# Patient Record
Sex: Male | Born: 1965 | Race: White | Hispanic: No | Marital: Married | Smoking: Never smoker
Health system: Southern US, Community
[De-identification: ages and names within clinical notes are randomized; demographics above are authoritative.]

## PROBLEM LIST (undated history)

## (undated) DIAGNOSIS — I10 Essential (primary) hypertension: Secondary | ICD-10-CM

## (undated) DIAGNOSIS — Z860100 Personal history of colon polyps, unspecified: Secondary | ICD-10-CM

## (undated) DIAGNOSIS — Z87442 Personal history of urinary calculi: Secondary | ICD-10-CM

## (undated) DIAGNOSIS — Z8719 Personal history of other diseases of the digestive system: Secondary | ICD-10-CM

## (undated) DIAGNOSIS — K219 Gastro-esophageal reflux disease without esophagitis: Secondary | ICD-10-CM

## (undated) DIAGNOSIS — K5790 Diverticulosis of intestine, part unspecified, without perforation or abscess without bleeding: Secondary | ICD-10-CM

## (undated) DIAGNOSIS — N400 Enlarged prostate without lower urinary tract symptoms: Secondary | ICD-10-CM

## (undated) HISTORY — PX: APPENDECTOMY: SHX54

## (undated) HISTORY — DX: Essential (primary) hypertension: I10

## (undated) HISTORY — DX: Gastro-esophageal reflux disease without esophagitis: K21.9

## (undated) HISTORY — PX: OTHER SURGICAL HISTORY: SHX169

---

## 1999-12-15 HISTORY — PX: CARDIAC CATHETERIZATION: SHX172

## 2000-03-09 ENCOUNTER — Ambulatory Visit (HOSPITAL_COMMUNITY): Admission: RE | Admit: 2000-03-09 | Discharge: 2000-03-09 | Payer: Self-pay | Admitting: Family Medicine

## 2000-09-01 ENCOUNTER — Encounter: Payer: Self-pay | Admitting: Emergency Medicine

## 2000-09-01 ENCOUNTER — Emergency Department (HOSPITAL_COMMUNITY): Admission: EM | Admit: 2000-09-01 | Discharge: 2000-09-01 | Payer: Self-pay | Admitting: Emergency Medicine

## 2000-09-23 ENCOUNTER — Encounter: Payer: Self-pay | Admitting: Cardiovascular Disease

## 2000-09-23 ENCOUNTER — Ambulatory Visit (HOSPITAL_COMMUNITY): Admission: RE | Admit: 2000-09-23 | Discharge: 2000-09-23 | Payer: Self-pay | Admitting: Cardiovascular Disease

## 2002-04-02 ENCOUNTER — Emergency Department (HOSPITAL_COMMUNITY): Admission: EM | Admit: 2002-04-02 | Discharge: 2002-04-02 | Payer: Self-pay | Admitting: Emergency Medicine

## 2002-04-02 ENCOUNTER — Encounter: Payer: Self-pay | Admitting: Emergency Medicine

## 2005-12-10 ENCOUNTER — Emergency Department (HOSPITAL_COMMUNITY): Admission: EM | Admit: 2005-12-10 | Discharge: 2005-12-10 | Payer: Self-pay | Admitting: Emergency Medicine

## 2009-02-01 ENCOUNTER — Ambulatory Visit: Payer: Self-pay | Admitting: Family Medicine

## 2009-02-01 DIAGNOSIS — I10 Essential (primary) hypertension: Secondary | ICD-10-CM | POA: Insufficient documentation

## 2009-02-01 DIAGNOSIS — K219 Gastro-esophageal reflux disease without esophagitis: Secondary | ICD-10-CM | POA: Insufficient documentation

## 2009-02-01 DIAGNOSIS — F17200 Nicotine dependence, unspecified, uncomplicated: Secondary | ICD-10-CM | POA: Insufficient documentation

## 2009-02-01 DIAGNOSIS — E669 Obesity, unspecified: Secondary | ICD-10-CM | POA: Insufficient documentation

## 2009-06-08 ENCOUNTER — Emergency Department (HOSPITAL_COMMUNITY): Admission: EM | Admit: 2009-06-08 | Discharge: 2009-06-08 | Payer: Self-pay | Admitting: Emergency Medicine

## 2010-04-21 ENCOUNTER — Telehealth: Payer: Self-pay | Admitting: Family Medicine

## 2010-04-25 ENCOUNTER — Ambulatory Visit: Payer: Self-pay | Admitting: Family Medicine

## 2010-04-27 LAB — CONVERTED CEMR LAB
ALT: 35 units/L (ref 0–53)
AST: 27 units/L (ref 0–37)
Albumin: 4.1 g/dL (ref 3.5–5.2)
Alkaline Phosphatase: 84 units/L (ref 39–117)
BUN: 15 mg/dL (ref 6–23)
Basophils Absolute: 0 10*3/uL (ref 0.0–0.1)
Basophils Relative: 0.4 % (ref 0.0–3.0)
Bilirubin, Direct: 0.1 mg/dL (ref 0.0–0.3)
CO2: 32 meq/L (ref 19–32)
Calcium: 9.4 mg/dL (ref 8.4–10.5)
Chloride: 105 meq/L (ref 96–112)
Cholesterol: 202 mg/dL — ABNORMAL HIGH (ref 0–200)
Creatinine, Ser: 1 mg/dL (ref 0.4–1.5)
Direct LDL: 108.7 mg/dL
Eosinophils Absolute: 0.5 10*3/uL (ref 0.0–0.7)
Eosinophils Relative: 5.2 % — ABNORMAL HIGH (ref 0.0–5.0)
GFR calc non Af Amer: 81.92 mL/min (ref >60)
Glucose, Bld: 90 mg/dL (ref 70–99)
HCT: 43.6 % (ref 39.0–52.0)
HDL: 30.4 mg/dL — ABNORMAL LOW (ref >39.00)
Hemoglobin: 15 g/dL (ref 13.0–17.0)
Lymphocytes Relative: 34.8 % (ref 12.0–46.0)
Lymphs Abs: 3.2 10*3/uL (ref 0.7–4.0)
MCHC: 34.3 g/dL (ref 30.0–36.0)
MCV: 89.8 fL (ref 78.0–100.0)
Monocytes Absolute: 0.5 10*3/uL (ref 0.1–1.0)
Monocytes Relative: 5.5 % (ref 3.0–12.0)
Neutro Abs: 5 10*3/uL (ref 1.4–7.7)
Neutrophils Relative %: 54.1 % (ref 43.0–77.0)
Platelets: 242 10*3/uL (ref 150.0–400.0)
Potassium: 4.8 meq/L (ref 3.5–5.1)
RBC: 4.86 M/uL (ref 4.22–5.81)
RDW: 13.4 % (ref 11.5–14.6)
Sodium: 144 meq/L (ref 135–145)
Total Bilirubin: 0.7 mg/dL (ref 0.3–1.2)
Total CHOL/HDL Ratio: 7
Total Protein: 6.9 g/dL (ref 6.0–8.3)
Triglycerides: 320 mg/dL — ABNORMAL HIGH (ref 0.0–149.0)
VLDL: 64 mg/dL — ABNORMAL HIGH (ref 0.0–40.0)
WBC: 9.2 10*3/uL (ref 4.5–10.5)

## 2010-05-14 ENCOUNTER — Ambulatory Visit: Payer: Self-pay | Admitting: Family Medicine

## 2010-10-14 ENCOUNTER — Telehealth: Payer: Self-pay | Admitting: Family Medicine

## 2010-10-15 ENCOUNTER — Ambulatory Visit: Payer: Self-pay | Admitting: Family Medicine

## 2010-10-15 DIAGNOSIS — H6120 Impacted cerumen, unspecified ear: Secondary | ICD-10-CM | POA: Insufficient documentation

## 2010-10-15 DIAGNOSIS — H6121 Impacted cerumen, right ear: Secondary | ICD-10-CM | POA: Insufficient documentation

## 2011-01-13 NOTE — Progress Notes (Signed)
Summary: Nadolol  Phone Note Refill Request Call back at Home Phone [phone]   Refills Requested: Medication #1:  NADOLOL 20 MG TABS Take 1 tablet by mouth once a day [facility]  Initial call taken by: Melody Comas,  Apr 21, 2010 9:08 AM  Follow-up for Phone Call        Refill sent to  pharmacy. Follow-up by: Lowella Petties CMA,  Apr 21, 2010 9:17 AM    Prescriptions: NADOLOL 20 MG TABS (NADOLOL) Take 1 tablet by mouth once a day  #30 x 0   Entered by:   Lowella Petties CMA   Authorized by:   Hannah Beat MD   Signed by:   Lowella Petties CMA on 04/21/2010   Method used:   Electronically to        [facility]* (retail)       [address]       [place], [place]  [address]       Ph: [phone]       Fax: [phone]   RxID:   [id number]

## 2011-01-13 NOTE — Progress Notes (Signed)
Summary: ? Ear drops  Phone Note Call from Patient Call back at [phone]   Caller: Spouse Call For: Edgar Beat MD Summary of Call: Patient is scheduled to come in tomorrow to have his ears cleaned out. Patient has some OTC drops that he has used in the past to help with this (does not know the name of them) and wants to know should he use them tonight prior to his appt? Initial call taken by: Sydell Axon LPN,  October 14, 2010 9:17 AM  Follow-up for Phone Call        yes Follow-up by: Kerby Nora MD,  October 14, 2010 11:20 AM  Additional Follow-up for Phone Call Additional follow up Details #1::        Patient advised via message okay to use drops Additional Follow-up by: Benny Lennert CMA Duncan Dull),  October 14, 2010 11:36 AM

## 2011-01-13 NOTE — Assessment & Plan Note (Signed)
Summary: EARS/CLE   Vital Signs:  Patient profile:   45 year old male Height:      68 inches Weight:      237.0 pounds BMI:     36.17 Temp:     99.2 degrees F oral Pulse rate:   80 / minute Pulse rhythm:   regular BP sitting:   140 / 90  (left arm) Cuff size:   large  Vitals Entered By: Benny Lennert CMA Duncan Dull) (October 15, 2010 3:30 PM)  History of Present Illness: Chief complaint wants ears cleaned  the patient is having some muffled hearing, and has a chronic cerumen impaction. He is having some discomfort, right greater than left ear.  No fever, chills, sweats, nausea or vomiting. Ear eating and drinking normally.  GEN: Well-developed,well-nourished,in no acute distress; alert,appropriate and cooperative throughout examination HEENT: Normocephalic and atraumatic without obvious abnormalities. No apparent alopecia or balding. Ears, externally no deformitiescomplete cerumen impaction, right, with left incomplete cerumen impaction. Nontender to palpation. PULM: Breathing comfortably in no respiratory distress EXT: No clubbing, cyanosis, or edema PSYCH: Normally interactive. Cooperative during the interview. Pleasant. Friendly and conversant. Not anxious or depressed appearing. Normal, full affect.   Allergies (verified): No Known Drug Allergies   Impression & Recommendations:  Problem # 1:  CERUMEN IMPACTION, BILATERAL (ICD-380.4) no infection, ears flushed.  Complete Medication List: 1)  Nadolol 20 Mg Tabs (Nadolol) .... Take 1 tablet by mouth once a day 2)  Nexium 40 Mg Cpdr (Esomeprazole magnesium) .... Take 1 tablet by mouth once a day 3)  Aspirin 81 Mg Tabs (Aspirin) .... Take 1 tablet by mouth once a day   Orders Added: 1)  Est. Patient Level III [56213]    Current Allergies (reviewed today): No known allergies

## 2011-01-13 NOTE — Assessment & Plan Note (Signed)
Summary: cpx   Vital Signs:  Patient profile:   45 year old male Height:      68 inches Weight:      235.8 pounds BMI:     35.98 Temp:     98.2 degrees F oral Pulse rate:   80 / minute Pulse rhythm:   regular BP sitting:   110 / 70  (left arm) Cuff size:   large  Vitals Entered By: Benny Lennert CMA Duncan Dull) (May 14, 2010 2:48 PM)  History of Present Illness: Chief complaint cpx     Preventive Screening-Counseling & Management  Alcohol-Tobacco     Alcohol drinks/day: <1     Smoking Status: never     Cans of tobacco/week: 3     Tobacco Counseling: to quit use of tobacco products  Caffeine-Diet-Exercise     Diet Counseling: to improve diet; diet is suboptimal     Does Patient Exercise: yes     Exercise (avg: min/session): 30-60     Times/week: 5     Exercise Counseling: to improve exercise regimen  Hep-HIV-STD-Contraception     HIV Risk: no risk noted     STD Risk: no risk noted     Testicular SE Education/Counseling to perform regular STE     Sun Exposure Counseling: to decrease sun exposure      Sexual History:  currently monogamous.        Drug Use:  never.    Clinical Review Panels:  Lipid Management   Cholesterol:  202 (04/25/2010)   HDL (good cholesterol):  30.40 (04/25/2010)  CBC   WBC:  9.2 (04/25/2010)   RBC:  4.86 (04/25/2010)   Hgb:  15.0 (04/25/2010)   Hct:  43.6 (04/25/2010)   Platelets:  242.0 (04/25/2010)   MCV  89.8 (04/25/2010)   MCHC  34.3 (04/25/2010)   RDW  13.4 (04/25/2010)   PMN:  54.1 (04/25/2010)   Lymphs:  34.8 (04/25/2010)   Monos:  5.5 (04/25/2010)   Eosinophils:  5.2 (04/25/2010)   Basophil:  0.4 (04/25/2010)  Complete Metabolic Panel   Glucose:  90 (04/25/2010)   Sodium:  144 (04/25/2010)   Potassium:  4.8 (04/25/2010)   Chloride:  105 (04/25/2010)   CO2:  32 (04/25/2010)   BUN:  15 (04/25/2010)   Creatinine:  1.0 (04/25/2010)   Albumin:  4.1 (04/25/2010)   Total Protein:  6.9 (04/25/2010)   Calcium:  9.4  (04/25/2010)   Total Bili:  0.7 (04/25/2010)   Alk Phos:  84 (04/25/2010)   SGPT (ALT):  35 (04/25/2010)   SGOT (AST):  27 (04/25/2010)   Allergies (verified): No Known Drug Allergies  Past History:  Past medical, surgical, family and social histories (including risk factors) reviewed, and no changes noted (except as noted below).  Past Medical History: Reviewed history from 02/01/2009 and no changes required. HYPERTENSION (ICD-401.9) GERD (ICD-530.81) (Hiatal Hernia) Dips 1/2 tin a day    Past Surgical History: Reviewed history from 02/01/2009 and no changes required. Appendectomy   -  45 years old  Cardiac Cath,  ~ 2001, clean  Family History: Reviewed history from 02/01/2009 and no changes required. Family History Diabetes 1st degree relative (Mom) Father, d/c accident Sibs, healthy  Social History: Reviewed history from 02/01/2009 and no changes required. Chews tobacco, No smoking Married Maisie Fus bus Alcohol use-no Drug use-no Regular exercise-no 5 people living at [employer] Patient Exercise:  yes HIV Risk:  no risk noted STD Risk:  no risk noted Sexual History:  currently  monogamous Drug Use:  never  Review of Systems  General: Denies fever, chills, sweats, anorexia, fatigue, weakness, malaise Eyes: Denies blurring, vision loss ENT: Denies earache, nasal congestion, nosebleeds, sore throat, and hoarseness.  Cardiovascular: Denies chest pains, palpitations, syncope, dyspnea on exertion,  Respiratory: Denies cough, dyspnea at rest, excessive sputum,wheeezing GI: Denies nausea, vomiting, diarrhea, constipation, change in bowel habits, abdominal pain, melena, hematochezia GU: Denies dysuria, hematuria, discharge, urinary frequency, urinary hesitancy, nocturia, incontinence, genital sores, decreased libido Musculoskeletal: Denies back pain, joint pain Derm: Denies rash, itching Neuro: Denies  paresthesias, frequent falls, frequent headaches, and difficulty  walking.  Psych: Denies depression, anxiety Endocrine: Denies cold intolerance, heat intolerance, polydipsia, polyphagia, polyuria, and unusual weight change.  Heme: Denies enlarged lymph nodes Allergy: No hayfever   Otherwise, the pertinent positives and negatives are listed above and in the HPI, otherwise a full review of systems has been reviewed and is negative unless noted positive.    Impression & Recommendations:  Problem # 1:  HEALTH MAINTENANCE EXAM (ICD-V70.0) The patient's preventative maintenance and recommended screening tests for an annual wellness exam were reviewed in full today. Brought up to date unless services declined.  Counselled on the importance of diet, exercise, and its role in overall health and mortality. The patient's FH and SH was reviewed, including their home life, tobacco status, and drug and alcohol status.   Complete Medication List: 1)  Nadolol 20 Mg Tabs (Nadolol) .... Take 1 tablet by mouth once a day 2)  Nexium 40 Mg Cpdr (Esomeprazole magnesium) .... Take 1 tablet by mouth once a day 3)  Aspirin 81 Mg Tabs (Aspirin) .... Take 1 tablet by mouth once a day  Patient Instructions: 1)  Desitin or A and D Prescriptions: NEXIUM 40 MG CPDR (ESOMEPRAZOLE MAGNESIUM) Take 1 tablet by mouth once a day  #30 x 11   Entered and Authorized by:   Hannah Beat MD   Signed by:   Hannah Beat MD on 05/14/2010   Method used:   Print then Give to Patient   RxID:   [id number] NADOLOL 20 MG TABS (NADOLOL) Take 1 tablet by mouth once a day  #30 x 11   Entered and Authorized by:   Hannah Beat MD   Signed by:   Hannah Beat MD on 05/14/2010   Method used:   Print then Give to Patient   RxID:   [id number]   Current Allergies (reviewed today): No known allergies  Physical Exam General Appearance: well developed, well nourished, no acute distress Eyes: conjunctiva and lids normal, PERRLA, EOMI Ears, Nose, Mouth, Throat: TM clear, nares  clear, oral exam WNL Neck: supple, no lymphadenopathy, no thyromegaly, no JVD Respiratory: clear to auscultation and percussion, respiratory effort normal Cardiovascular: regular rate and rhythm, S1-S2, no murmur, rub or gallop, no bruits, peripheral pulses normal and symmetric, no cyanosis, clubbing, edema or varicosities Chest: no scars, masses, tenderness; no asymmetry, skin changes, nipple discharge, no gynecomastia   Gastrointestinal: soft, non-tender; no hepatosplenomegaly, masses; active bowel sounds all quadrants,  no masses, tenderness, hemorrhoids - SOME IRRITATION AT EXT ANUS Genitourinary: no hernia, testicular mass, penile discharge, priapism or prostate enlargement Lymphatic: no cervical, axillary or inguinal adenopathy Musculoskeletal: gait normal, muscle tone and strength WNL, no joint swelling, effusions, discoloration, crepitus  Skin: clear, good turgor, color WNL, no rashes, lesions, or ulcerations Neurologic: normal mental status, normal reflexes, normal strength, sensation, and motion Psychiatric: alert; oriented to person, place and time Other Exam:

## 2011-03-23 LAB — DIFFERENTIAL
Basophils Absolute: 0.1 10*3/uL (ref 0.0–0.1)
Basophils Relative: 1 % (ref 0–1)
Eosinophils Absolute: 0.4 10*3/uL (ref 0.0–0.7)
Eosinophils Relative: 3 % (ref 0–5)
Lymphocytes Relative: 25 % (ref 12–46)
Lymphs Abs: 2.6 10*3/uL (ref 0.7–4.0)
Monocytes Absolute: 0.8 10*3/uL (ref 0.1–1.0)
Monocytes Relative: 7 % (ref 3–12)
Neutro Abs: 6.7 10*3/uL (ref 1.7–7.7)
Neutrophils Relative %: 64 % (ref 43–77)

## 2011-03-23 LAB — POCT I-STAT, CHEM 8
BUN: 12 mg/dL (ref 6–23)
Calcium, Ion: 1.09 mmol/L — ABNORMAL LOW (ref 1.12–1.32)
Chloride: 107 mEq/L (ref 96–112)
Creatinine, Ser: 1 mg/dL (ref 0.4–1.5)
Glucose, Bld: 88 mg/dL (ref 70–99)

## 2011-03-23 LAB — POCT CARDIAC MARKERS
Myoglobin, poc: 78.7 ng/mL (ref 12–200)
Troponin i, poc: <0.05 ng/mL (ref 0.00–0.09)
Troponin i, poc: <0.05 ng/mL (ref 0.00–0.09)

## 2011-03-23 LAB — CBC
HCT: 45.3 % (ref 39.0–52.0)
Hemoglobin: 15.8 g/dL (ref 13.0–17.0)
MCHC: 34.8 g/dL (ref 30.0–36.0)
MCV: 89.5 fL (ref 78.0–100.0)
Platelets: 230 10*3/uL (ref 150–400)
RBC: 5.06 MIL/uL (ref 4.22–5.81)
RDW: 13.4 % (ref 11.5–15.5)
WBC: 10.5 10*3/uL (ref 4.0–10.5)

## 2011-05-01 NOTE — Cardiovascular Report (Signed)
[facility]  Patient:    Edgar Saunders, Edgar Saunders                       MRN: [id number] Proc. Date: 09/23/00 Adm. Date:  [id number] Attending:  Berry, Jonathan [place] CC:         Cardiac Catheterization Laboratory  [facility]  [place] C. Andrey Campanile, M.D.   Cardiac Catheterization  INDICATIONS:  Mr. [name] is a 45 year old married white male, referred because of evaluation of chest pain and shortness of breath.  He had a negative risk factor profile.  He had a Cardiolite which suggested the possibility of mild anteroapical ischemia.  He presents now for diagnostic coronary arteriography to rule out CAD.  DESCRIPTION OF PROCEDURE:  The patient was brought to the second floor [facility] Cardiac Catheterization Laboratory in the postabsorptive state. He was premedicated with p.o. Valium and IV Versed.  His right groin was prepped and shaved in the usual sterile fashion.  One percent Xylocaine was used for local anesthesia.  A 6-French sheath was inserted into his right femoral artery using the standard Seldinger technique.  Six French right and left Judkins diagnostic catheters, along with a 6-French pigtail catheter were used for selective coronary angiography, left ventriculography, and distal abdominal aortography.  Omnipaque dye was used for the entirety of the case. Retrograde aorta, left ventricular, and pullback pressures were recorded.  HEMODYNAMICS: 1. Aortic systolic pressure 127, diastolic pressure 73. 2. Left ventricular systolic pressure 127, end-diastolic pressure 16.  LEFT CORONARY ANGIOGRAPHY: 1. Left main:  Normal. 2. Left anterior descending artery:  Normal. 3. Left circumflex artery:  This is a codominant vessel giving off a    posterolateral branch and is free of significant disease. 4. Right coronary artery:  This is a codominant vessel giving off a PDA    and is free of significant disease.  LEFT  VENTRICULOGRAPHY:  RAO left ventriculogram was performed using 25 cc of Omnipaque dye at 12 cc/sec.  The overall LV ejection fraction is estimated at approximately 50% to 55%, without focal wall motion abnormality.  DISTAL ABDOMINAL AORTOGRAPHY:  Distal abdominal aortogram was performed using 20 cc of Omnipaque dye at 20 cc/sec.  The renal arteries were widely patent. The infrarenal, abdominal aorta, and iliac bifurcation appear free of significant atherosclerotic changes.  IMPRESSION:  [name] has normal coronary arteries and normal left ventricular function.  I believe his Cardiolite was a false-positive.  The sheath entry was at the bifurcation of the aceta and profunda femoris, making Perclose not feasible.  The sheaths were removed and pressure was held on the groin to achieve hemostasis.  The patient left the laboratory in stable condition.  He will be discharged home today as an outpatient on proton pump inhibition and will see me back in the office in two weeks.  Dr. Margrett Rud was notified of these results. DD:  09/23/00 TD:  09/23/00 Job: [id number]

## 2011-05-20 ENCOUNTER — Encounter: Payer: Self-pay | Admitting: Family Medicine

## 2011-05-20 ENCOUNTER — Ambulatory Visit (INDEPENDENT_AMBULATORY_CARE_PROVIDER_SITE_OTHER): Payer: BC Managed Care – PPO | Admitting: Family Medicine

## 2011-05-20 VITALS — BP 130/80 | HR 78 | Temp 98.8°F | Ht 69.0 in | Wt 240.8 lb

## 2011-05-20 DIAGNOSIS — J209 Acute bronchitis, unspecified: Secondary | ICD-10-CM

## 2011-05-20 MED ORDER — AZITHROMYCIN 250 MG PO TABS: ORAL_TABLET | ORAL | Status: DC

## 2011-05-20 NOTE — Progress Notes (Signed)
Acute Bronchitis: Patient presents for presents evaluation of bilateral ear congestion, nasal congestion, productive cough with sputum described as yellow and green, sore throat and sweats. Symptoms began 2 weeks ago and are gradually worsening since that time.  Past history is significant for no history of pneumonia or bronchitis. Taking OTC meds.  Some occ sweats, no fever  Patient Active Problem List  Diagnoses  . OBESITY  . TOBACCO USE  . CERUMEN IMPACTION, BILATERAL  . HYPERTENSION  . GERD   Past Medical History  Diagnosis Date  . Hypertension   . GERD (gastroesophageal reflux disease)    Past Surgical History  Procedure Date  . Appendectomy     45 years old  . Cardiac catheterization 2001    clean   History  Substance Use Topics  . Smoking status: Never Smoker   . Smokeless tobacco: Current User    Types: Chew  . Alcohol Use: No   Family History  Problem Relation Age of Onset  . Diabetes Mother    No Known Allergies No current outpatient prescriptions on file prior to visit.   ROS: GEN: Acute illness details above GI: Tolerating PO intake GU: maintaining adequate hydration and urination Pulm: No SOB Interactive and getting along well at home.  Otherwise, ROS is as per the HPI.   Physical Exam  Blood pressure 130/80, pulse 78, temperature 98.8 F (37.1 C), temperature source Oral, height 5\' 9"  (1.753 m), weight 240 lb 12 oz (109.203 kg).  GEN: A and O x 3. WDWN. NAD.    ENT: Nose clear, ext NML.  No LAD.  No JVD.  TM's clear. Oropharynx clear.  PULM: Normal WOB, no distress. No crackles, wheezes, rhonchi. CV: RRR, no M/G/R, No rubs, No JVD.    EXT: warm and well-perfused, No c/c/e. PSYCH: Pleasant and conversant.  A/P: Acute bronchitis: discussed plan of care. Given length of symptoms and overall history, will treat with ABX in this case. Continue with additional supportive care, cough medications, liquids, sleep, steam / vaporizer.

## 2011-06-16 ENCOUNTER — Other Ambulatory Visit: Payer: Self-pay | Admitting: *Deleted

## 2011-06-16 MED ORDER — NADOLOL 20 MG PO TABS: 20.0000 mg | ORAL_TABLET | Freq: Every day | ORAL | Status: DC

## 2011-06-18 ENCOUNTER — Other Ambulatory Visit: Payer: Self-pay | Admitting: *Deleted

## 2011-06-18 MED ORDER — ESOMEPRAZOLE MAGNESIUM 40 MG PO CPDR: 40.0000 mg | DELAYED_RELEASE_CAPSULE | Freq: Every day | ORAL | Status: DC

## 2011-10-19 ENCOUNTER — Ambulatory Visit (INDEPENDENT_AMBULATORY_CARE_PROVIDER_SITE_OTHER): Payer: BC Managed Care – PPO | Admitting: Family Medicine

## 2011-10-19 ENCOUNTER — Encounter: Payer: Self-pay | Admitting: Family Medicine

## 2011-10-19 VITALS — BP 120/74 | HR 65 | Temp 97.4°F | Ht 70.0 in | Wt 239.8 lb

## 2011-10-19 DIAGNOSIS — R300 Dysuria: Secondary | ICD-10-CM

## 2011-10-19 LAB — POCT URINALYSIS DIPSTICK
Bilirubin, UA: NEGATIVE
Nitrite, UA: NEGATIVE
Protein, UA: NEGATIVE
pH, UA: 7

## 2011-10-19 MED ORDER — CIPROFLOXACIN HCL 250 MG PO TABS: 250.0000 mg | ORAL_TABLET | Freq: Two times a day (BID) | ORAL | Status: AC

## 2011-10-19 NOTE — Progress Notes (Signed)
  Subjective:    [name], male    DOB: 07-01-1966, 45 y.o.   MRN: [id number]  HPI  [name], a 45 y.o. male presents today in the office for the following:    UTI? Every few days will feel like has a bladder infection. Some pain with urination. Smell and color is off.   07/18/2011 - TMP/SMP. He believes that he had a urinary tract infection around that time. He was given a week's worth of sulfa antibiotics. He is unclear if he had a urinary culture done or not. We do not have those notes available.  He denies any risk for STD. No penile discharge or ulceration along the glans or shaft.  The PMH, PSH, Social History, Family History, Medications, and allergies have been reviewed in [facility], and have been updated if relevant.   Review of Systems ROS: GEN: Acute illness details above GI: Tolerating PO intake GU: maintaining adequate hydration and urination Pulm: No SOB Interactive and getting along well at home.  Otherwise, ROS is as per the HPI.     Objective:   Physical Exam   Physical Exam  Blood pressure 120/74, pulse 65, temperature 97.4 F (36.3 C), temperature source Oral, height 5\' 10"  (1.778 m), weight 239 lb 12.8 oz (108.773 kg), SpO2 99.00%.  GEN: WDWN, NAD, Non-toxic, A & O x 3 HEENT: Atraumatic, Normocephalic. Neck supple. No masses, No LAD. Ears and Nose: No external deformity. ABD: S, NT, ND, +BS. No rebound tenderness. No HSM.  GU: normal male. No ulcer, discharge.  EXTR: No c/c/e NEURO Normal gait.  PSYCH: Normally interactive. Conversant. Not depressed or anxious appearing.  Calm demeanor.        Assessment & Plan:   1. Dysuria  POCT Urinalysis Dipstick, ciprofloxacin (CIPRO) 250 MG tablet, Urine culture    Probable UTI with blood on UA. Will presumptively treat and obtain urine culture.

## 2011-10-22 ENCOUNTER — Other Ambulatory Visit: Payer: Self-pay | Admitting: *Deleted

## 2012-01-26 ENCOUNTER — Other Ambulatory Visit: Payer: Self-pay | Admitting: *Deleted

## 2012-01-26 MED ORDER — NADOLOL 20 MG PO TABS: 20.0000 mg | ORAL_TABLET | Freq: Every day | ORAL | Status: DC

## 2012-03-15 ENCOUNTER — Encounter: Payer: Self-pay | Admitting: Family Medicine

## 2012-03-15 ENCOUNTER — Ambulatory Visit (INDEPENDENT_AMBULATORY_CARE_PROVIDER_SITE_OTHER): Payer: BC Managed Care – PPO | Admitting: Family Medicine

## 2012-03-15 VITALS — BP 128/90 | HR 72 | Temp 98.6°F | Wt 243.8 lb

## 2012-03-15 DIAGNOSIS — H6120 Impacted cerumen, unspecified ear: Secondary | ICD-10-CM

## 2012-03-15 NOTE — Patient Instructions (Signed)
Ears cleaned out today. Let us know if not improving as expected or any ear pain after cleaning. Cerumen Impaction A cerumen impaction is when the wax in your ear forms a plug. This plug usually causes reduced hearing. Sometimes it also causes an earache or dizziness. Removing a cerumen impaction can be difficult and painful. The wax sticks to the ear canal. The canal is sensitive and bleeds easily. If you try to remove a heavy wax buildup with a cotton tipped swab, you may push it in further. Irrigation with water, suction, and small ear curettes may be used to clear out the wax. If the impaction is fixed to the skin in the ear canal, ear drops may be needed for a few days to loosen the wax. People who build up a lot of wax frequently can use ear wax removal products available in your local drugstore. SEEK MEDICAL CARE IF:  You develop an earache, increased hearing loss, or marked dizziness. Document Released: 01/07/2005 Document Revised: 11/19/2011 Document Reviewed: 02/27/2010 [facility] Patient Information 2012 [place], [place].

## 2012-03-15 NOTE — Progress Notes (Signed)
  Subjective:    [name], male    DOB: 1966/05/23, 46 y.o.   MRN: [id number]  HPI CC: cerumen impaction  R>L ear impaction, affecting hearing.  Uses earplugs at work.  Tends to get impactions.  No ear pain, drainage from ears. Tried some peroxide prior to coming in today.   OTC and home remedies have not worked.  Review of Systems Per HPI    Objective:   Physical Exam  Nursing note and vitals reviewed. Constitutional: He appears well-developed and well-nourished. No distress.  HENT:  Head: Normocephalic and atraumatic.  Right Ear: Hearing, tympanic membrane and external ear normal.  Left Ear: External ear normal. Decreased hearing is noted.  Mouth/Throat: Oropharynx is clear and moist. No oropharyngeal exudate.       R TM normal.  Some dry cerumen removed with curette. L ear - soft wax present, covering canal and TM.  Eyes: Conjunctivae and EOM are normal. Pupils are equal, round, and reactive to light. No scleral icterus.  Neck: Normal range of motion. Neck supple.  Lymphadenopathy:    He has no cervical adenopathy.       Assessment & Plan:

## 2012-03-15 NOTE — Assessment & Plan Note (Addendum)
Left sided - irrigation performed. Significant impacted cerumen removed, good hearing afterwards.   TM slightly irritated but intact. Discussed things to watch for ie concern for external infection.

## 2012-03-24 ENCOUNTER — Other Ambulatory Visit: Payer: Self-pay | Admitting: *Deleted

## 2012-03-24 NOTE — Telephone Encounter (Signed)
PATIENT NOT SEEN FOR CPX SINCE 2011 OK TO REFILL?

## 2012-03-25 MED ORDER — ESOMEPRAZOLE MAGNESIUM 40 MG PO CPDR: 40.0000 mg | DELAYED_RELEASE_CAPSULE | Freq: Every day | ORAL | Status: DC

## 2012-03-25 NOTE — Telephone Encounter (Signed)
rx refill and patient advised need for cpx

## 2012-03-25 NOTE — Telephone Encounter (Signed)
Ok to refill #30, 2 refills  cpx this summer

## 2012-09-19 ENCOUNTER — Other Ambulatory Visit: Payer: Self-pay | Admitting: *Deleted

## 2012-09-19 MED ORDER — ESOMEPRAZOLE MAGNESIUM 40 MG PO CPDR: 40.0000 mg | DELAYED_RELEASE_CAPSULE | Freq: Every day | ORAL | Status: DC

## 2012-09-19 MED ORDER — NADOLOL 20 MG PO TABS: 20.0000 mg | ORAL_TABLET | Freq: Every day | ORAL | Status: DC

## 2012-09-19 NOTE — Addendum Note (Signed)
Addended by: Liane Comber C on: 09/19/2012 09:30 AM   Modules accepted: Orders

## 2012-10-05 ENCOUNTER — Other Ambulatory Visit: Payer: Self-pay | Admitting: *Deleted

## 2012-10-05 MED ORDER — NADOLOL 20 MG PO TABS: 20.0000 mg | ORAL_TABLET | Freq: Every day | ORAL | Status: DC

## 2012-11-23 ENCOUNTER — Ambulatory Visit (INDEPENDENT_AMBULATORY_CARE_PROVIDER_SITE_OTHER): Payer: BC Managed Care – PPO | Admitting: Family Medicine

## 2012-11-23 ENCOUNTER — Encounter: Payer: Self-pay | Admitting: Family Medicine

## 2012-11-23 VITALS — BP 120/70 | HR 68 | Temp 98.8°F | Ht 70.0 in | Wt 243.2 lb

## 2012-11-23 DIAGNOSIS — R300 Dysuria: Secondary | ICD-10-CM

## 2012-11-23 DIAGNOSIS — I10 Essential (primary) hypertension: Secondary | ICD-10-CM

## 2012-11-23 DIAGNOSIS — M79676 Pain in unspecified toe(s): Secondary | ICD-10-CM

## 2012-11-23 DIAGNOSIS — M79609 Pain in unspecified limb: Secondary | ICD-10-CM

## 2012-11-23 LAB — POCT URINALYSIS DIPSTICK
Bilirubin, UA: NEGATIVE
Ketones, UA: NEGATIVE
Leukocytes, UA: NEGATIVE
Spec Grav, UA: 1.01

## 2012-11-23 MED ORDER — ESOMEPRAZOLE MAGNESIUM 40 MG PO CPDR: 40.0000 mg | DELAYED_RELEASE_CAPSULE | Freq: Every day | ORAL | Status: DC

## 2012-11-23 MED ORDER — NADOLOL 20 MG PO TABS: 20.0000 mg | ORAL_TABLET | Freq: Every day | ORAL | Status: DC

## 2012-11-23 MED ORDER — NITROFURANTOIN MONOHYD MACRO 100 MG PO CAPS: 100.0000 mg | ORAL_CAPSULE | Freq: Two times a day (BID) | ORAL | Status: DC

## 2012-11-23 NOTE — Progress Notes (Signed)
Nature conservation officer at [facility] [address] [place] [address] Phone: [phone] Fax: [phone]  Date:  11/23/2012   Name:  Edgar Saunders   DOB:  [birth year]   MRN:  [id number] Gender: male Age: 46 y.o.  PCP:  Hannah Beat, MD  Evaluating MD: Hannah Beat, MD   Chief Complaint: Urinary Tract Infection   History of Present Illness:  Edgar Saunders is a 46 y.o. pleasant patient who presents with the following:  UTI: Got a stomache virus a couple of weeks ago --- starting to burn a little bit and a little bit of a smell. No STD. No discharge. Some dysuria and he noticed some blood in his urine this morning  Mole on L head - raised  Toe: pain at 1st MTP with a nodule. No redness or warmth ever.    Patient Active Problem List  Diagnosis  . OBESITY  . TOBACCO USE  . Cerumen impaction  . HYPERTENSION  . GERD    Past Medical History  Diagnosis Date  . Hypertension   . GERD (gastroesophageal reflux disease)     Past Surgical History  Procedure Date  . Appendectomy     83 years old  . Cardiac catheterization 2001    clean    History  Substance Use Topics  . Smoking status: Never Smoker   . Smokeless tobacco: Current User    Types: Chew  . Alcohol Use: No    Family History  Problem Relation Age of Onset  . Diabetes Mother     No Known Allergies  Medication list has been reviewed and updated.  Outpatient Prescriptions Prior to Visit  Medication Sig Dispense Refill  . aspirin 81 MG tablet Take 81 mg by mouth daily.        Marland Kitchen esomeprazole (NEXIUM) 40 MG capsule Take 1 capsule (40 mg total) by mouth daily before breakfast.  30 capsule  0  . nadolol (CORGARD) 20 MG tablet Take 1 tablet (20 mg total) by mouth daily.  30 tablet  0   Last reviewed on 11/23/2012  2:56 PM by Consuello Masse, CMA  Review of Systems:  ROS: GEN: Acute illness details above GI: Tolerating PO intake GU: maintaining adequate hydration and  urination Pulm: No SOB Interactive and getting along well at home.  Otherwise, ROS is as per the HPI.   Physical Examination: Filed Vitals:   11/23/12 1456  BP: 120/70  Pulse: 68  Temp: 98.8 F (37.1 C)  TempSrc: Oral  Height: 5\' 10"  (1.778 m)  Weight: 243 lb 4 oz (110.337 kg)  SpO2: 98%    Body mass index is 34.90 kg/(m^2). Ideal Body Weight: Weight in (lb) to have BMI = 25: 173.9    GEN: WDWN, NAD, Non-toxic, A & O x 3 HEENT: Atraumatic, Normocephalic. Neck supple. No masses, No LAD. Ears and Nose: No external deformity. CV: RRR, No M/G/R. No JVD. No thrill. No extra heart sounds. PULM: CTA B, no wheezes, crackles, rhonchi. No retractions. No resp. distress. No accessory muscle use. No cvat MSK: 1st MTP on L with nodule, decreased relative ROM EXTR: No c/c/e NEURO Normal gait.  PSYCH: Normally interactive. Conversant. Not depressed or anxious appearing.  Calm demeanor.   Assessment and Plan:  1. Dysuria  POCT Urinalysis Dipstick, Urine culture  2. Toe pain    3. HYPERTENSION     Cx, presume uti with sx and history  Refill all other meds  Reassure about toe -  oa changes  "mole" has appearance of seb K - should be ok to follow  Orders Today:  Orders Placed This Encounter  Procedures  . Urine culture  . POCT Urinalysis Dipstick    Updated Medication List: (Includes new medications, updates to list, dose adjustments) Meds ordered this encounter  Medications  . nitrofurantoin, macrocrystal-monohydrate, (MACROBID) 100 MG capsule    Sig: Take 1 capsule (100 mg total) by mouth 2 (two) times daily.    Dispense:  14 capsule    Refill:  0  . nadolol (CORGARD) 20 MG tablet    Sig: Take 1 tablet (20 mg total) by mouth daily.    Dispense:  30 tablet    Refill:  5  . esomeprazole (NEXIUM) 40 MG capsule    Sig: Take 1 capsule (40 mg total) by mouth daily before breakfast.    Dispense:  30 capsule    Refill:  5    Medications Discontinued: Medications  Discontinued During This Encounter  Medication Reason  . nadolol (CORGARD) 20 MG tablet Reorder  . esomeprazole (NEXIUM) 40 MG capsule Reorder     Hannah Beat, MD

## 2012-11-25 LAB — URINE CULTURE: Colony Count: NO GROWTH

## 2012-11-29 ENCOUNTER — Telehealth: Payer: Self-pay | Admitting: Family Medicine

## 2012-11-29 NOTE — Telephone Encounter (Signed)
Pt left v/m stating that he had missed a call from someone at the office calling about his recent lab results.  Asks that he be called back.

## 2012-11-29 NOTE — Telephone Encounter (Signed)
Will call patient and document in results note

## 2013-02-22 ENCOUNTER — Encounter: Payer: Self-pay | Admitting: Family Medicine

## 2013-02-22 ENCOUNTER — Ambulatory Visit (INDEPENDENT_AMBULATORY_CARE_PROVIDER_SITE_OTHER): Payer: BC Managed Care – PPO | Admitting: Family Medicine

## 2013-02-22 VITALS — BP 120/84 | HR 69 | Temp 98.5°F | Ht 70.0 in | Wt 235.0 lb

## 2013-02-22 DIAGNOSIS — R300 Dysuria: Secondary | ICD-10-CM

## 2013-02-22 DIAGNOSIS — R310 Gross hematuria: Secondary | ICD-10-CM

## 2013-02-22 LAB — POCT URINALYSIS DIPSTICK
Leukocytes, UA: NEGATIVE
Nitrite, UA: NEGATIVE
Protein, UA: 30
Urobilinogen, UA: NEGATIVE

## 2013-02-22 NOTE — Patient Instructions (Addendum)
REFERRAL: GO THE THE FRONT ROOM AT THE ENTRANCE OF OUR CLINIC, NEAR CHECK IN. ASK FOR Edgar Saunders. SHE WILL HELP YOU SET UP YOUR REFERRAL. DATE: TIME:  

## 2013-02-22 NOTE — Progress Notes (Signed)
Nature conservation officer at [facility] [address] [place] [address] Phone: [phone] Fax: [phone]  Date:  02/22/2013   Name:  Edgar Saunders   DOB:  02-01-1966   MRN:  [id number] Gender: male Age: 47 y.o.  Primary Physician:  Hannah Beat, MD  Evaluating MD: Hannah Beat, MD   Chief Complaint: Urinary Tract Infection   History of Present Illness:  Edgar Saunders is a 47 y.o. pleasant patient who presents with the following:  Gross blood.  He has been having gross blood.   The patient presents with new onset symptoms of some dysuria, and he also has had some gross blood in his urine over the last few days. He denies any urgency. He denies any penile discharge and no penile ulceration or other symptoms. Denies any STD exposure, and has been faithful to his wife for many years. He also is a smokeless tobacco user, but denies being a smoker.  In December, 2013, the patient had what we had assumed was a UTI, and I placed him on some Macrobid. He did have some blood on his UA at that time, but ultimately his urine culture was negative, but he became clinically asymptomatic on Macrobid.  Patient Active Problem List  Diagnosis  . OBESITY  . TOBACCO USE  . HYPERTENSION  . GERD    Past Medical History  Diagnosis Date  . Hypertension   . GERD (gastroesophageal reflux disease)     Past Surgical History  Procedure Laterality Date  . Appendectomy      60 years old  . Cardiac catheterization  2001    clean    History   Social History  . Marital Status: Married    Spouse Name: N/A    Number of Children: N/A  . Years of Education: N/A   Occupational History  . Not on file.   Social History Main Topics  . Smoking status: Never Smoker   . Smokeless tobacco: Current User    Types: Chew  . Alcohol Use: No  . Drug Use: No  . Sexually Active: Not on file   Other Topics Concern  . Not on file   Social History Narrative   No regular exercise   5  people living at residence    Family History  Problem Relation Age of Onset  . Diabetes Mother     No Known Allergies  Medication list has been reviewed and updated.  Outpatient Prescriptions Prior to Visit  Medication Sig Dispense Refill  . aspirin 81 MG tablet Take 81 mg by mouth daily.        Marland Kitchen esomeprazole (NEXIUM) 40 MG capsule Take 1 capsule (40 mg total) by mouth daily before breakfast.  30 capsule  5  . nadolol (CORGARD) 20 MG tablet Take 1 tablet (20 mg total) by mouth daily.  30 tablet  5  . nitrofurantoin, macrocrystal-monohydrate, (MACROBID) 100 MG capsule Take 1 capsule (100 mg total) by mouth 2 (two) times daily.  14 capsule  0   No facility-administered medications prior to visit.    Review of Systems:  No weight loss, fevers chills, weight gain. No chest pain or shortness of breath.  Physical Examination: BP 120/84  Pulse 69  Temp(Src) 98.5 F (36.9 C) (Oral)  Ht 5\' 10"  (1.778 m)  Wt 235 lb (106.595 kg)  BMI 33.72 kg/m2  SpO2 97%  Ideal Body Weight: Weight in (lb) to have BMI = 25: 173.9   GEN: WDWN,  NAD, Non-toxic, A & O x 3 HEENT: Atraumatic, Normocephalic. Neck supple. No masses, No LAD. Ears and Nose: No external deformity. CV: RRR, No M/G/R. No JVD. No thrill. No extra heart sounds. PULM: CTA B, no wheezes, crackles, rhonchi. No retractions. No resp. distress. No accessory muscle use. EXTR: No c/c/e NEURO Normal gait.  PSYCH: Normally interactive. Conversant. Not depressed or anxious appearing.  Calm demeanor.    Assessment and Plan:  Gross hematuria - Plan: Ambulatory referral to Urology  Dysuria - Plan: POCT Urinalysis Dipstick, Urine culture  Gross clots of blood were seen in the urine sample.  The patient has negative nitrites and negative leukocyte esterase, and I do not think that we can assume this is a urinary tract infection. He has gross blood in his urine, and we need to investigate this to ensure that he has no potential  neoplasm. Consult urology.  Urine cx.  Results for orders placed in visit on 02/22/13  POCT URINALYSIS DIPSTICK      Result Value Range   Color, UA light pink/yellow     Clarity, UA cloudy     Glucose, UA neg     Bilirubin, UA neg     Ketones, UA neg     Spec Grav, UA >=1.030     Blood, UA large     pH, UA 6.0     Protein, UA 30     Urobilinogen, UA negative     Nitrite, UA neg     Leukocytes, UA Negative       Orders Today:  Orders Placed This Encounter  Procedures  . Urine culture  . Ambulatory referral to Urology    Referral Priority:  Routine    Referral Type:  Consultation    Referral Reason:  Specialty Services Required    Requested Specialty:  Urology    Number of Visits Requested:  1  . POCT Urinalysis Dipstick    Updated Medication List: (Includes new medications, updates to list, dose adjustments) No orders of the defined types were placed in this encounter.    Medications Discontinued: Medications Discontinued During This Encounter  Medication Reason  . nitrofurantoin, macrocrystal-monohydrate, (MACROBID) 100 MG capsule Error      Signed, Spencer T. Copland, MD 02/22/2013 11:11 AM

## 2013-02-24 ENCOUNTER — Telehealth: Payer: Self-pay

## 2013-02-24 LAB — URINE CULTURE: Colony Count: NO GROWTH

## 2013-02-24 NOTE — Telephone Encounter (Signed)
Pt missed call and requested lab results 02/22/13. Pt notified as instructed from result note.

## 2013-07-03 ENCOUNTER — Encounter: Payer: Self-pay | Admitting: Family Medicine

## 2013-07-03 ENCOUNTER — Ambulatory Visit (INDEPENDENT_AMBULATORY_CARE_PROVIDER_SITE_OTHER): Payer: BC Managed Care – PPO | Admitting: Family Medicine

## 2013-07-03 VITALS — BP 130/80 | HR 72 | Temp 98.9°F | Ht 70.0 in | Wt 247.0 lb

## 2013-07-03 DIAGNOSIS — H6123 Impacted cerumen, bilateral: Secondary | ICD-10-CM

## 2013-07-03 DIAGNOSIS — H6120 Impacted cerumen, unspecified ear: Secondary | ICD-10-CM

## 2013-07-03 NOTE — Progress Notes (Signed)
Ceruminosis is noted.  Wax is removed by syringing and manual debridement. Instructions for home care to prevent wax buildup are given.  

## 2013-10-11 ENCOUNTER — Other Ambulatory Visit: Payer: Self-pay | Admitting: Family Medicine

## 2014-01-06 ENCOUNTER — Other Ambulatory Visit: Payer: Self-pay | Admitting: Family Medicine

## 2014-01-30 ENCOUNTER — Emergency Department: Payer: Self-pay | Admitting: Emergency Medicine

## 2014-04-09 ENCOUNTER — Emergency Department (HOSPITAL_COMMUNITY)
Admission: EM | Admit: 2014-04-09 | Discharge: 2014-04-10 | Disposition: A | Discharge status: Home / Self Care | Payer: BC Managed Care – PPO | Attending: Emergency Medicine | Admitting: Emergency Medicine

## 2014-04-09 ENCOUNTER — Emergency Department (HOSPITAL_COMMUNITY): Payer: BC Managed Care – PPO

## 2014-04-09 ENCOUNTER — Encounter (HOSPITAL_COMMUNITY): Payer: Self-pay | Admitting: Emergency Medicine

## 2014-04-09 DIAGNOSIS — R0602 Shortness of breath: Secondary | ICD-10-CM | POA: Insufficient documentation

## 2014-04-09 DIAGNOSIS — Z79899 Other long term (current) drug therapy: Secondary | ICD-10-CM | POA: Insufficient documentation

## 2014-04-09 DIAGNOSIS — IMO0002 Reserved for concepts with insufficient information to code with codable children: Secondary | ICD-10-CM | POA: Insufficient documentation

## 2014-04-09 DIAGNOSIS — Z7982 Long term (current) use of aspirin: Secondary | ICD-10-CM | POA: Insufficient documentation

## 2014-04-09 DIAGNOSIS — K219 Gastro-esophageal reflux disease without esophagitis: Secondary | ICD-10-CM | POA: Insufficient documentation

## 2014-04-09 DIAGNOSIS — Y929 Unspecified place or not applicable: Secondary | ICD-10-CM | POA: Insufficient documentation

## 2014-04-09 DIAGNOSIS — Z9889 Other specified postprocedural states: Secondary | ICD-10-CM | POA: Insufficient documentation

## 2014-04-09 DIAGNOSIS — I10 Essential (primary) hypertension: Secondary | ICD-10-CM | POA: Insufficient documentation

## 2014-04-09 DIAGNOSIS — S46911A Strain of unspecified muscle, fascia and tendon at shoulder and upper arm level, right arm, initial encounter: Secondary | ICD-10-CM

## 2014-04-09 DIAGNOSIS — X58XXXA Exposure to other specified factors, initial encounter: Secondary | ICD-10-CM | POA: Insufficient documentation

## 2014-04-09 DIAGNOSIS — Y939 Activity, unspecified: Secondary | ICD-10-CM | POA: Insufficient documentation

## 2014-04-09 LAB — CBC
HEMATOCRIT: 45.5 % (ref 39.0–52.0)
HEMOGLOBIN: 15.6 g/dL (ref 13.0–17.0)
MCH: 30.5 pg (ref 26.0–34.0)
MCHC: 34.3 g/dL (ref 30.0–36.0)
MCV: 89 fL (ref 78.0–100.0)
Platelets: 274 10*3/uL (ref 150–400)
RBC: 5.11 MIL/uL (ref 4.22–5.81)
RDW: 12.8 % (ref 11.5–15.5)
WBC: 14.8 10*3/uL — AB (ref 4.0–10.5)

## 2014-04-09 LAB — BASIC METABOLIC PANEL
BUN: 13 mg/dL (ref 6–23)
CHLORIDE: 102 meq/L (ref 96–112)
CO2: 26 meq/L (ref 19–32)
CREATININE: 0.94 mg/dL (ref 0.50–1.35)
Calcium: 9.5 mg/dL (ref 8.4–10.5)
GFR calc non Af Amer: >90 mL/min (ref >90)
Glucose, Bld: 94 mg/dL (ref 70–99)
POTASSIUM: 4.6 meq/L (ref 3.7–5.3)
Sodium: 142 mEq/L (ref 137–147)

## 2014-04-09 LAB — I-STAT TROPONIN, ED: Troponin i, poc: 0 ng/mL (ref 0.00–0.08)

## 2014-04-09 LAB — PRO B NATRIURETIC PEPTIDE: Pro B Natriuretic peptide (BNP): 16.8 pg/mL (ref 0–125)

## 2014-04-09 NOTE — ED Notes (Signed)
Pt reports right shoulder pain since yesterday AM, described as "aching." Denies injury, full ROM. Pt also reports SOB starting today that is worsened by laying flat. Pt in NAD. VSS. Pt denies CP, N/V, back pain, weakness, dizziness.

## 2014-04-09 NOTE — ED Notes (Signed)
Pt reports intermittent right shoulder pain for past couple of days. States that he uses his arms to lift things repeatedly at work. States that he has put ice and biofreeze to the shoulder with brief relief. Had a friend that died of a heart attack that had arm pain and became concerned that he was having a heart attack, too.  Denies nausea, vomiting and dizziness with the arm pain.

## 2014-04-10 ENCOUNTER — Encounter (HOSPITAL_COMMUNITY): Payer: Self-pay

## 2014-04-10 ENCOUNTER — Emergency Department (HOSPITAL_COMMUNITY): Payer: BC Managed Care – PPO

## 2014-04-10 MED ORDER — DIPHENHYDRAMINE HCL 50 MG/ML IJ SOLN
25.0000 mg | Freq: Once | INTRAMUSCULAR | Status: AC
  Administered 2014-04-10: 25 mg via INTRAVENOUS
  Filled 2014-04-10: qty 1

## 2014-04-10 MED ORDER — IOHEXOL 350 MG/ML SOLN
100.0000 mL | Freq: Once | INTRAVENOUS | Status: AC | PRN
  Administered 2014-04-10: 100 mL via INTRAVENOUS

## 2014-04-10 MED ORDER — IBUPROFEN 600 MG PO TABS: 600.0000 mg | ORAL_TABLET | Freq: Four times a day (QID) | ORAL | Status: DC | PRN

## 2014-04-10 MED ORDER — KETOROLAC TROMETHAMINE 30 MG/ML IJ SOLN
30.0000 mg | Freq: Once | INTRAMUSCULAR | Status: AC
  Administered 2014-04-10: 30 mg via INTRAVENOUS
  Filled 2014-04-10: qty 1

## 2014-04-10 MED ORDER — METHOCARBAMOL 500 MG PO TABS: 500.0000 mg | ORAL_TABLET | Freq: Three times a day (TID) | ORAL | Status: DC | PRN

## 2014-04-10 MED ORDER — SODIUM CHLORIDE 0.9 % IV BOLUS (SEPSIS)
500.0000 mL | Freq: Once | INTRAVENOUS | Status: AC
  Administered 2014-04-10: 500 mL via INTRAVENOUS

## 2014-04-10 NOTE — ED Notes (Signed)
Pt up to BR to void. When asked about pain, pt reported "to tell you the truth I haven't even noticed so it must be better."

## 2014-04-10 NOTE — ED Notes (Signed)
Returned from [place].  Placed back onto monitor.

## 2014-04-10 NOTE — ED Notes (Signed)
Patient transported to CT 

## 2014-04-10 NOTE — ED Notes (Signed)
Pt states he is itching at this time. Pt is unsure why.

## 2014-04-10 NOTE — ED Notes (Signed)
Pt has been itching since CT scan.  Reported to Dr Lita Mains, order rec'd.

## 2014-04-10 NOTE — Discharge Instructions (Signed)

## 2014-04-11 NOTE — ED Provider Notes (Signed)
CSN: [id number]     Arrival date & time 04/09/14  1928 History   First MD Initiated Contact with Patient 04/09/14 2304     Chief Complaint  Patient presents with  . Shoulder Pain  . Shortness of Breath     (Consider location/radiation/quality/duration/timing/severity/associated sxs/prior Treatment) HPI Patient presents with right shoulder pain described as aching starting yesterday morning. 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 He has mild shortness of breath that started today is worsen when lying flat. He denies any chest pain, nausea or vomiting, lower sugary pain or swelling. He's had no recent extended travel or surgery. Past Medical History  Diagnosis Date  . Hypertension   . GERD (gastroesophageal reflux disease)    Past Surgical History  Procedure Laterality Date  . Appendectomy      48 years old  . Cardiac catheterization  2001    clean   Family History  Problem Relation Age of Onset  . Diabetes Mother    History  Substance Use Topics  . Smoking status: Never Smoker   . Smokeless tobacco: Current User    Types: Chew  . Alcohol Use: Yes     Comment: rarely    Review of Systems  Constitutional: Negative for fever and chills.  Respiratory: Positive for shortness of breath. Negative for cough and wheezing.   Cardiovascular: Negative for chest pain.  Gastrointestinal: Negative for nausea, vomiting, abdominal pain and diarrhea.  Musculoskeletal: Positive for arthralgias and myalgias. Negative for back pain, neck pain and neck stiffness.  Skin: Negative for rash and wound.  Neurological: Negative for dizziness, weakness, numbness and headaches.  All other systems reviewed and are negative.     Allergies  Review of patient's allergies indicates no known allergies.  Home Medications   Prior to Admission medications   Medication Sig Start Date End Date Taking? Authorizing Provider  aspirin 81 MG tablet Take 81 mg by mouth daily.     Yes  Historical Provider, MD  Aspirin-Acetaminophen-Caffeine (GOODY HEADACHE PO) Take 1 packet by mouth every 6 (six) hours as needed (for pain).   Yes Historical Provider, MD  esomeprazole (NEXIUM) 40 MG capsule Take 40 mg by mouth daily as needed (for acid reflux).   Yes Historical Provider, MD  nadolol (CORGARD) 20 MG tablet Take 20 mg by mouth daily.   Yes Historical Provider, MD  ibuprofen (ADVIL,MOTRIN) 600 MG tablet Take 1 tablet (600 mg total) by mouth every 6 (six) hours as needed. 04/10/14   Julianne Rice, MD  methocarbamol (ROBAXIN) 500 MG tablet Take 1 tablet (500 mg total) by mouth every 8 (eight) hours as needed for muscle spasms. 04/10/14   Julianne Rice, MD   BP 141/84  Pulse 66  Temp(Src) 98.1 F (36.7 C) (Oral)  Resp 19  Ht 5\' 9"  (1.753 m)  Wt 241 lb (109.317 kg)  BMI 35.57 kg/m2  SpO2 94% Physical Exam  Nursing note and vitals reviewed. Constitutional: He is oriented to person, place, and time. He appears well-developed and well-nourished. No distress.  HENT:  Head: Normocephalic and atraumatic.  Mouth/Throat: Oropharynx is clear and moist.  Eyes: EOM are normal. Pupils are equal, round, and reactive to light.  Neck: Normal range of motion. Neck supple.  No posterior cervical midline tenderness to palpation.  Cardiovascular: Normal rate and regular rhythm.   Pulmonary/Chest: Effort normal and breath sounds normal. No respiratory distress. He has no wheezes. He has no rales. He exhibits no tenderness.  Abdominal: Soft.  Bowel sounds are normal. He exhibits no distension and no mass. There is no tenderness. There is no rebound and no guarding.  Musculoskeletal: Normal range of motion. He exhibits tenderness (tenderness to palpation over the lateral deltoid on the right. Patient has normal range of motion of the right shoulder. Distal pulses intact.). He exhibits no edema.  Neurological: He is alert and oriented to person, place, and time.  5/5 motor in all extremities.  Sensation is grossly intact.  Skin: Skin is warm and dry. No rash noted. No erythema.  Psychiatric: He has a normal mood and affect. His behavior is normal.    ED Course  Procedures (including critical care time) Labs Review Labs Reviewed  CBC - Abnormal; Notable for the following:    WBC 14.8 (*)    All other components within normal limits  BASIC METABOLIC PANEL  PRO B NATRIURETIC PEPTIDE  I-STAT TROPOININ, ED    Imaging Review Dg Chest 2 View  04/09/2014   CLINICAL DATA:  Two-day history of right pain and dyspnea  EXAM: CHEST  2 VIEW  COMPARISON:  DG CHEST 2 VIEW dated 06/08/2009  FINDINGS: There is mild elevation of the right hemidiaphragm as compared to the left. There is atelectasis versus scarring in the right mid lung. The left lung appears clear. There is no pleural effusion. The cardiopericardial silhouette is normal in size. The pulmonary vascularity is not engorged. The observed portions of the bony thorax appear normal.  IMPRESSION: Mild elevation of the right hemidiaphragm appears new. There is also atelectasis versus scarring in the right mid lung. There is no discrete alveolar pneumonia nor evidence of CHF. A followup PA and lateral chest x-ray with deep inspiratory effort would be useful to further evaluate the chest. Chest CT scanning may be ultimately indicated to exclude occult pathology resulting in phrenic nerve dysfunction.   Electronically Signed   By: Jenafer Winterton  [place]   On: 04/09/2014 21:52   Ct Angio Chest Pe W/cm &/or Wo Cm  04/10/2014   CLINICAL DATA:  Right shoulder pain and shortness of breath.  EXAM: CT ANGIOGRAPHY CHEST WITH CONTRAST  TECHNIQUE: Multidetector CT imaging of the chest was performed using the standard protocol during bolus administration of intravenous contrast. Multiplanar CT image reconstructions and MIPs were obtained to evaluate the vascular anatomy.  CONTRAST:  135mL OMNIPAQUE IOHEXOL 350 MG/ML SOLN  COMPARISON:  Chest radiograph performed  04/09/2014  FINDINGS: There is no evidence of pulmonary embolus.  Mild right basilar atelectasis is noted. The lungs are otherwise clear. There is no evidence of significant focal consolidation, pleural effusion or pneumothorax. No masses are identified; no abnormal focal contrast enhancement is seen.  The mediastinum is unremarkable in appearance. No mediastinal lymphadenopathy is seen. No pericardial effusion is identified. The great vessels are grossly unremarkable in appearance. Incidental note is made of a direct origin of the left vertebral artery from the aortic arch. No axillary lymphadenopathy is seen. The visualized portions of the thyroid gland are unremarkable in appearance.  There is diffuse fatty infiltration within the liver, with mild sparing about the gallbladder fossa. The spleen is mildly enlarged, measuring 14.5 cm in length. The gallbladder is unremarkable in appearance. The visualized portions of the pancreas, adrenal glands and both kidneys are within normal limits.  No acute osseous abnormalities are seen.  Review of the MIP images confirms the above findings.  IMPRESSION: 1. No evidence of pulmonary embolus. 2. Mild right basilar atelectasis noted; lungs otherwise clear. 3. Diffuse fatty  infiltration within the liver. 4. Mild splenomegaly noted.   Electronically Signed   By: Garald Balding M.D.   On: 04/10/2014 05:35     EKG Interpretation None      Date: 04/11/2014  Rate: 74  Rhythm: normal sinus rhythm  QRS Axis: normal  Intervals: normal  ST/T Wave abnormalities: normal  Conduction Disutrbances:none  Narrative Interpretation:   Old EKG Reviewed: none available   MDM   Final diagnoses:  Right shoulder strain    Suspect muscular skeletal calls for the patient's symptoms. We'll treat with anti-inflammatory and muscle relaxant. I have low suspicion for coronary artery disease. Patient CT of his chest without any evidence of PE. Given the patient return precautions and  has voice understanding.    Julianne Rice, MD 04/11/14 [phone]

## 2014-04-25 ENCOUNTER — Other Ambulatory Visit: Payer: Self-pay | Admitting: Family Medicine

## 2014-04-25 NOTE — Telephone Encounter (Signed)
Last office visit 07/03/2013.  Ok to refill?

## 2014-11-03 ENCOUNTER — Other Ambulatory Visit: Payer: Self-pay | Admitting: Family Medicine

## 2014-11-03 NOTE — Telephone Encounter (Signed)
Last office visit 07/03/2013.  Ok to refill?

## 2014-11-05 NOTE — Telephone Encounter (Signed)
Ok to refill 30, 1 ref.  Please schedule him a 15 min f/u OV, since he has not been seen in a long time - 30 min CPX is ok if I have any upcoming open slots

## 2014-12-11 ENCOUNTER — Encounter: Payer: Self-pay | Admitting: Family Medicine

## 2014-12-11 ENCOUNTER — Ambulatory Visit (INDEPENDENT_AMBULATORY_CARE_PROVIDER_SITE_OTHER): Payer: BC Managed Care – PPO | Admitting: Family Medicine

## 2014-12-11 VITALS — BP 128/82 | HR 60 | Temp 98.2°F | Wt 227.5 lb

## 2014-12-11 DIAGNOSIS — J209 Acute bronchitis, unspecified: Secondary | ICD-10-CM

## 2014-12-11 MED ORDER — HYDROCODONE-HOMATROPINE 5-1.5 MG/5ML PO SYRP: ORAL_SOLUTION | ORAL | Status: DC

## 2014-12-11 MED ORDER — AZITHROMYCIN 250 MG PO TABS: ORAL_TABLET | ORAL | Status: DC

## 2014-12-11 NOTE — Progress Notes (Signed)
I made an error on meds.  Corrected.  New Prescriptions   AZITHROMYCIN (ZITHROMAX) 250 MG TABLET    2 tabs po on day 1, then 1 tab po for 4 days   HYDROCODONE-HOMATROPINE (HYCODAN) 5-1.5 MG/5ML SYRUP    1 tsp po at night before bed prn cough

## 2014-12-11 NOTE — Progress Notes (Signed)
Pre visit review using our clinic review tool, if applicable. No additional management support is needed unless otherwise documented below in the visit note. 

## 2014-12-11 NOTE — Progress Notes (Signed)
Dr. Frederico Hamman T. Othmar Ringer, MD, [facility] [place], [address] Phone: [phone] Fax: [phone]  12/11/2014  Patient: Edgar Saunders, MRN: [id number], DOB: Jul 22, 1966, 48 y.o.  Primary Physician:  Owens Loffler, MD  Chief Complaint: Cough  Subjective:   Edgar Saunders is a 48 y.o. very pleasant male patient who presents with the following:  Patient presents for presents evaluation of myalgias, nasal congestion, productive cough, rhinorrhea , sneezing and sore throat. Symptoms began > 1 week ago and are gradually worsening since that time.    Wife and daughter had it and went away. Does not smoke.  Ran a little bit of a fever, sweats and felt bad then. Yesterday was coughing.  Coughing some at night. Coughing all the time.   Risk Factors: dips, no smoking  The patient denies significant nausea, vomitting, diarrhea, rash, diffuse arthralgia or myalgia. They also deny high fever.   Past Medical History, Surgical History, Social History, Family History, Problem List, Medications, and Allergies have been reviewed and updated if relevant.  ROS: GEN: Acute illness details above GI: Tolerating PO intake GU: maintaining adequate hydration and urination Pulm: No SOB Interactive and getting along well at home.  Otherwise, ROS is as per the HPI.   Objective:   BP 128/82 mmHg  Pulse 60  Temp(Src) 98.2 F (36.8 C) (Oral)  Wt 227 lb 8 oz (103.193 kg)  SpO2 97%   GEN: A and O x 3. WDWN. NAD.    ENT: Nose clear, ext NML.  No LAD.  No JVD.  TM's clear. Oropharynx clear.  PULM: Normal WOB, no distress. No crackles, wheezes, + scattered rhonchi. CV: RRR, no M/G/R, No rubs, No JVD.   EXT: warm and well-perfused, No c/c/e. PSYCH: Pleasant and conversant.   Laboratory and Imaging Data: No results found.  Assessment and Plan:   Acute bronchitis, unspecified organism  At this point, we reviewed supportive  care. Given the length of time and risk factors, treat with medications below.  Medical decision making includes all plans, orders, medications, and patient instructions reviewed face to face.   Follow-up: No Follow-up on file.  New Prescriptions   AZITHROMYCIN (ZITHROMAX) 250 MG TABLET    2 tabs po on day 1, then 1 tab po for 4 days   HYDROCODONE-HOMATROPINE (HYCODAN) 5-1.5 MG/5ML SYRUP    2 tabs po on day 1, then 1 tab po for 4 days   No orders of the defined types were placed in this encounter.    Signed,  Maud Deed. Booker Bhatnagar, MD   Patient's Medications  New Prescriptions   AZITHROMYCIN (ZITHROMAX) 250 MG TABLET    2 tabs po on day 1, then 1 tab po for 4 days   HYDROCODONE-HOMATROPINE (HYCODAN) 5-1.5 MG/5ML SYRUP    2 tabs po on day 1, then 1 tab po for 4 days  Previous Medications   ASPIRIN 81 MG TABLET    Take 81 mg by mouth daily.     ASPIRIN-ACETAMINOPHEN-CAFFEINE (GOODY HEADACHE PO)    Take 1 packet by mouth every 6 (six) hours as needed (for pain).   ESOMEPRAZOLE (NEXIUM) 40 MG CAPSULE    Take 40 mg by mouth daily as needed (for acid reflux).   IBUPROFEN (ADVIL,MOTRIN) 600 MG TABLET    Take 1 tablet (600 mg total) by mouth every 6 (six) hours as needed.   METHOCARBAMOL (ROBAXIN) 500 MG TABLET    Take 1 tablet (  500 mg total) by mouth every 8 (eight) hours as needed for muscle spasms.   NADOLOL (CORGARD) 20 MG TABLET    TAKE 1 TABLET BY MOUTH DAILY  Modified Medications   No medications on file  Discontinued Medications   No medications on file

## 2014-12-11 NOTE — Addendum Note (Signed)
Addended by: Owens Loffler on: 12/11/2014 10:34 AM   Modules accepted: Orders

## 2014-12-12 ENCOUNTER — Telehealth: Payer: Self-pay | Admitting: Family Medicine

## 2014-12-12 NOTE — Telephone Encounter (Signed)
emmi emailed °

## 2015-01-03 ENCOUNTER — Other Ambulatory Visit: Payer: Self-pay | Admitting: Family Medicine

## 2015-01-03 NOTE — Telephone Encounter (Signed)
Ok to refill 30, 2 refills  CPX - please schedule. He is 49 yo.

## 2015-01-03 NOTE — Telephone Encounter (Signed)
Last office visit 12/11/2014.  Only seems to come in for acute visits.  Ok to refill?

## 2015-01-03 NOTE — Telephone Encounter (Signed)
Please schedule CPX with fasting labs prior with Dr. Lorelei Pont.

## 2015-01-03 NOTE — Telephone Encounter (Signed)
Pt called and will call back sometime next week to scheduled cpe + labs.

## 2015-04-08 ENCOUNTER — Other Ambulatory Visit: Payer: Self-pay | Admitting: Family Medicine

## 2015-04-11 ENCOUNTER — Encounter: Payer: Self-pay | Admitting: Family Medicine

## 2015-04-11 ENCOUNTER — Ambulatory Visit (INDEPENDENT_AMBULATORY_CARE_PROVIDER_SITE_OTHER): Payer: BLUE CROSS/BLUE SHIELD | Admitting: Family Medicine

## 2015-04-11 VITALS — BP 130/82 | HR 70 | Temp 98.5°F | Ht 67.25 in | Wt 230.0 lb

## 2015-04-11 DIAGNOSIS — I10 Essential (primary) hypertension: Secondary | ICD-10-CM

## 2015-04-11 MED ORDER — NADOLOL 20 MG PO TABS: 20.0000 mg | ORAL_TABLET | Freq: Every day | ORAL | Status: DC

## 2015-04-11 NOTE — Progress Notes (Signed)
Dr. Frederico Hamman T. Melburn Treiber, MD, [facility] [place], [address] Phone: [phone] Fax: [phone]  04/11/2015  Patient: Edgar Saunders, MRN: [id number], DOB: 09-19-1966, 49 y.o.  Primary Physician:  Owens Loffler, MD  Chief Complaint: Medication Refill  Subjective:   Edgar Saunders is a 49 y.o. very pleasant male patient who presents with the following:  Allergies are flared up some.   HTN: Tolerating all medications without side effects Stable and at goal No CP, no sob. No HA.  BP Readings from Last 3 Encounters:  04/11/15 130/82  12/11/14 128/82  04/10/14 382/50    Basic Metabolic Panel:    Component Value Date/Time   NA 142 04/09/2014 1958   K 4.6 04/09/2014 1958   CL 102 04/09/2014 1958   CO2 26 04/09/2014 1958   BUN 13 04/09/2014 1958   CREATININE 0.94 04/09/2014 1958   GLUCOSE 94 04/09/2014 1958   CALCIUM 9.5 04/09/2014 1958    Skin tag. Forehead / mole.  Seb Keratotis - cut off - ask partner.   No urgency - discussed colon, declined for now.  Past Medical History, Surgical History, Social History, Family History, Problem List, Medications, and Allergies have been reviewed and updated if relevant.  GEN: No acute illnesses, no fevers, chills. GI: No n/v/d, eating normally Pulm: No SOB Interactive and getting along well at home.  Otherwise, ROS is as per the HPI.  Objective:   BP 130/82 mmHg  Pulse 70  Temp(Src) 98.5 F (36.9 C) (Oral)  Ht 5' 7.25" (1.708 m)  Wt 230 lb (104.327 kg)  BMI 35.76 kg/m2  GEN: WDWN, NAD, Non-toxic, A & O x 3 HEENT: Atraumatic, Normocephalic. Neck supple. No masses, No LAD. Ears and Nose: No external deformity. CV: RRR, No M/G/R. No JVD. No thrill. No extra heart sounds. PULM: CTA B, no wheezes, crackles, rhonchi. No retractions. No resp. distress. No accessory muscle use. EXTR: No c/c/e NEURO Normal gait.  PSYCH: Normally interactive. Conversant. Not  depressed or anxious appearing.  Calm demeanor.   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 Will see if one of my partners can help him with this.  Signed,  Edgar Saunders. Edgar Zazueta, MD   Patient's Medications  New Prescriptions   No medications on file  Previous Medications   ASPIRIN 81 MG TABLET    Take 81 mg by mouth daily.     ASPIRIN-ACETAMINOPHEN-CAFFEINE (GOODY HEADACHE PO)    Take 1 packet by mouth every 6 (six) hours as needed (for pain).   ESOMEPRAZOLE (NEXIUM) 40 MG CAPSULE    Take 40 mg by mouth daily as needed (for acid reflux).   PROBIOTIC PRODUCT (PROBIOTIC PO)    Take 1 capsule by mouth daily.  Modified Medications   Modified Medication Previous Medication   NADOLOL (CORGARD) 20 MG TABLET nadolol (CORGARD) 20 MG tablet      Take 1 tablet (20 mg total) by mouth daily.    TAKE 1 TABLET BY MOUTH DAILY  Discontinued Medications   AZITHROMYCIN (ZITHROMAX) 250 MG TABLET    2 tabs po on day 1, then 1 tab po for 4 days   HYDROCODONE-HOMATROPINE (HYCODAN) 5-1.5 MG/5ML SYRUP    1 tsp po at night before bed prn cough   IBUPROFEN (ADVIL,MOTRIN) 600 MG TABLET    Take 1 tablet (600  mg total) by mouth every 6 (six) hours as needed.   METHOCARBAMOL (ROBAXIN) 500 MG TABLET    Take 1 tablet (500 mg total) by mouth every 8 (eight) hours as needed for muscle spasms.

## 2015-04-11 NOTE — Progress Notes (Signed)
Pre visit review using our clinic review tool, if applicable. No additional management support is needed unless otherwise documented below in the visit note. 

## 2015-10-21 ENCOUNTER — Other Ambulatory Visit: Payer: Self-pay | Admitting: Family Medicine

## 2016-01-04 ENCOUNTER — Emergency Department
Admission: EM | Admit: 2016-01-04 | Discharge: 2016-01-04 | Disposition: A | Discharge status: Home / Self Care | Payer: BLUE CROSS/BLUE SHIELD | Attending: Emergency Medicine | Admitting: Emergency Medicine

## 2016-01-04 DIAGNOSIS — Z79899 Other long term (current) drug therapy: Secondary | ICD-10-CM | POA: Diagnosis not present

## 2016-01-04 DIAGNOSIS — Z7982 Long term (current) use of aspirin: Secondary | ICD-10-CM | POA: Insufficient documentation

## 2016-01-04 DIAGNOSIS — I10 Essential (primary) hypertension: Secondary | ICD-10-CM | POA: Insufficient documentation

## 2016-01-04 DIAGNOSIS — T781XXA Other adverse food reactions, not elsewhere classified, initial encounter: Secondary | ICD-10-CM | POA: Insufficient documentation

## 2016-01-04 DIAGNOSIS — T7840XA Allergy, unspecified, initial encounter: Secondary | ICD-10-CM

## 2016-01-04 DIAGNOSIS — X58XXXA Exposure to other specified factors, initial encounter: Secondary | ICD-10-CM | POA: Insufficient documentation

## 2016-01-04 DIAGNOSIS — Y998 Other external cause status: Secondary | ICD-10-CM | POA: Insufficient documentation

## 2016-01-04 DIAGNOSIS — L500 Allergic urticaria: Secondary | ICD-10-CM | POA: Diagnosis not present

## 2016-01-04 DIAGNOSIS — Y9289 Other specified places as the place of occurrence of the external cause: Secondary | ICD-10-CM | POA: Insufficient documentation

## 2016-01-04 DIAGNOSIS — Y9389 Activity, other specified: Secondary | ICD-10-CM | POA: Diagnosis not present

## 2016-01-04 MED ORDER — DEXAMETHASONE SODIUM PHOSPHATE 10 MG/ML IJ SOLN
10.0000 mg | Freq: Once | INTRAMUSCULAR | Status: DC
  Filled 2016-01-04: qty 1

## 2016-01-04 MED ORDER — SODIUM CHLORIDE 0.9 % IV BOLUS (SEPSIS)
1000.0000 mL | Freq: Once | INTRAVENOUS | Status: AC
  Administered 2016-01-04: 1000 mL via INTRAVENOUS

## 2016-01-04 MED ORDER — METHYLPREDNISOLONE SODIUM SUCC 125 MG IJ SOLR
INTRAMUSCULAR | Status: AC
  Filled 2016-01-04: qty 2

## 2016-01-04 MED ORDER — DIPHENHYDRAMINE HCL 50 MG/ML IJ SOLN
50.0000 mg | Freq: Once | INTRAMUSCULAR | Status: AC
  Administered 2016-01-04: 50 mg via INTRAVENOUS

## 2016-01-04 MED ORDER — FAMOTIDINE IN NACL 20-0.9 MG/50ML-% IV SOLN
INTRAVENOUS | Status: AC
  Filled 2016-01-04: qty 50

## 2016-01-04 MED ORDER — DIPHENHYDRAMINE HCL 50 MG/ML IJ SOLN
INTRAMUSCULAR | Status: DC
Start: 2016-01-04 — End: 2016-01-05
  Filled 2016-01-04: qty 1

## 2016-01-04 MED ORDER — METHYLPREDNISOLONE SODIUM SUCC 125 MG IJ SOLR
125.0000 mg | Freq: Once | INTRAMUSCULAR | Status: AC
  Administered 2016-01-04: 125 mg via INTRAVENOUS

## 2016-01-04 MED ORDER — FAMOTIDINE IN NACL 20-0.9 MG/50ML-% IV SOLN
20.0000 mg | Freq: Once | INTRAVENOUS | Status: AC
  Administered 2016-01-04: 20 mg via INTRAVENOUS

## 2016-01-04 MED ORDER — PREDNISONE 20 MG PO TABS: 40.0000 mg | ORAL_TABLET | Freq: Every day | ORAL | Status: DC

## 2016-01-04 NOTE — Discharge Instructions (Signed)
You have been seen in the emergency department for an allergic reaction. Please take your prednisone as prescribed for the next 5 days even if you have feeling better. Please take Benadryl 50 mg (2 regular strength tablets) every 6 hours as needed for hives or itching. Return to the emergency department for any trouble breathing, or any swelling in your mouth, tongue or throat. Otherwise please follow up with her primary care physician in 2 days for recheck.   Allergies An allergy is an abnormal reaction to a substance by the body's defense system (immune system). Allergies can develop at any age. WHAT CAUSES ALLERGIES? An allergic reaction happens when the immune system mistakenly reacts to a normally harmless substance, called an allergen, as if it were harmful. The immune system releases antibodies to fight the substance. Antibodies eventually release a chemical called histamine into the bloodstream. The release of histamine is meant to protect the body from infection, but it also causes discomfort. An allergic reaction can be triggered by:  Eating an allergen.  Inhaling an allergen.  Touching an allergen. WHAT TYPES OF ALLERGIES ARE THERE? There are many types of allergies. Common types include:  Seasonal allergies. People with this type of allergy are usually allergic to substances that are only present during certain seasons, such as molds and pollens.  Food allergies.  Drug allergies.  Insect allergies.  Animal dander allergies. WHAT ARE SYMPTOMS OF ALLERGIES? Possible allergy symptoms include:  Swelling of the lips, face, tongue, mouth, or throat.  Sneezing, coughing, or wheezing.  Nasal congestion.  Tingling in the mouth.  Rash.  Itching.  Itchy, red, swollen areas of skin (hives).  Watery eyes.  Vomiting.  Diarrhea.  Dizziness.  Lightheadedness.  Fainting.  Trouble breathing or swallowing.  Chest tightness.  Rapid heartbeat. HOW ARE ALLERGIES  DIAGNOSED? Allergies are diagnosed with a medical and family history and one or more of the following:  Skin tests.  Blood tests.  A food diary. A food diary is a record of all the foods and drinks you have in a day and of all the symptoms you experience.  The results of an elimination diet. An elimination diet involves eliminating foods from your diet and then adding them back in one by one to find out if a certain food causes an allergic reaction. HOW ARE ALLERGIES TREATED? There is no cure for allergies, but allergic reactions can be treated with medicine. Severe reactions usually need to be treated at a hospital. HOW CAN REACTIONS BE PREVENTED? The best way to prevent an allergic reaction is by avoiding the substance you are allergic to. Allergy shots and medicines can also help prevent reactions in some cases. People with severe allergic reactions may be able to prevent a life-threatening reaction called anaphylaxis with a medicine given right after exposure to the allergen.   This information is not intended to replace advice given to you by your health care provider. Make sure you discuss any questions you have with your health care provider.   Document Released: 02/23/2003 Document Revised: 12/21/2014 Document Reviewed: 09/11/2014 Elsevier Interactive Patient Education [employer].

## 2016-01-04 NOTE — ED Notes (Signed)
Allergic rxt to unknown substance - started around 9pm. Hives, redness, itching, lips tingling. Tachycardic at 150

## 2016-01-04 NOTE — ED Provider Notes (Addendum)
[facility] Emergency Department Provider Note  Time seen: 10:21 PM  I have reviewed the triage vital signs and the nursing notes.   HISTORY  Chief Complaint Allergic Reaction    HPI Edgar Saunders is a 50 y.o. male with a past medical history of hypertension and gastric reflux who presents the emergency department with diffuse itching hives. According to the patient he ate at [employer] at approximately 7:30 PM. He states around 8:30-9 PM he began with diffuse itching and broke out in hives so he came to the emergency department. He took 2 Benadryl at home which have helped with the itching but states that is not helped with the rash. Denies any trouble breathing he is feeling tingling in his lips but denies any swelling sensation or throat swelling sensation. Patient denies any known allergies in the past. Denies any medication or food allergies.Denies dyspnea.     Past Medical History  Diagnosis Date  . Hypertension   . GERD (gastroesophageal reflux disease)     Patient Active Problem List   Diagnosis Date Noted  . OBESITY 02/01/2009  . TOBACCO USE 02/01/2009  . Essential hypertension 02/01/2009  . GERD 02/01/2009    Past Surgical History  Procedure Laterality Date  . Appendectomy      27 years old  . Cardiac catheterization  2001    clean    Current Outpatient Rx  Name  Route  Sig  Dispense  Refill  . aspirin 81 MG tablet   Oral   Take 81 mg by mouth daily.           . diphenhydrAMINE (SOMINEX) 25 MG tablet   Oral   Take 25 mg by mouth at bedtime as needed for sleep.         Marland Kitchen esomeprazole (NEXIUM) 40 MG capsule      TAKE ONE CAPSULE BY MOUTH EACH MORNING BEFORE BREAKFAST   30 capsule   5   . ibuprofen (ADVIL,MOTRIN) 200 MG tablet   Oral   Take 200 mg by mouth every 6 (six) hours as needed.         . nadolol (CORGARD) 20 MG tablet   Oral   Take 1 tablet (20 mg total) by mouth daily.   30 tablet   11      Allergies Review of patient's allergies indicates no known allergies.  Family History  Problem Relation Age of Onset  . Diabetes Mother     Social History Social History  Substance Use Topics  . Smoking status: Never Smoker   . Smokeless tobacco: Current User    Types: Chew  . Alcohol Use: Yes     Comment: rarely    Review of Systems Constitutional: Negative for fever. Cardiovascular: Negative for chest pain. Respiratory: Negative for shortness of breath. Gastrointestinal: Negative for abdominal pain. Denies nausea or vomiting Musculoskeletal: Negative for back pain. Neurological: Negative for headache 10-point ROS otherwise negative.  ____________________________________________   PHYSICAL EXAM:  VITAL SIGNS: ED Triage Vitals  Enc Vitals Group     BP 01/04/16 2145 153/89 mmHg     Pulse Rate 01/04/16 2145 150     Resp --      Temp --      Temp src --      SpO2 01/04/16 2145 100 %     Weight 01/04/16 2145 230 lb (104.327 kg)     Height 01/04/16 2145 5\' 9"  (1.753 m)     Head Cir --  Peak Flow --      Pain Score --      Pain Loc --      Pain Edu? --      Excl. in [place]? --    Constitutional: Alert and oriented. Well appearing and in no distress. Eyes: Normal exam ENT   Head: Normocephalic and atraumatic.   Mouth/Throat: Mucous membranes are moist. No oral edema noted. Cardiovascular: Normal rate, regular rhythm. No murmur Respiratory: Normal respiratory effort without tachypnea nor retractions. Breath sounds are clear. No wheeze. Gastrointestinal: Soft and nontender. No distention.  Musculoskeletal: Nontender with normal range of motion in all extremities.  Neurologic:  Normal speech and language. No gross focal neurologic deficits  Skin:  Diffuse hives/urticaria and erythema of the upper extremities and chest. Psychiatric: Mood and affect are normal. Speech and behavior are normal.  ____________________________________________   EKG reviewed  and interpreted by myself shows what appears to be sinus tachycardia at 148 bpm, narrow QRS, normal axis, slightly prolonged QTC of 558 ms, nonspecific ST changes. No ST elevations noted.   INITIAL IMPRESSION / ASSESSMENT AND PLAN / ED COURSE  Pertinent labs & imaging results that were available during my care of the patient were reviewed by me and considered in my medical decision making (see chart for details).  Patient presents the emergency department with apparent systemic allergic reaction to unknown allergen. Patient has diffuse hives over her entire upper body, he states itching is much improved after taking by mouth Benadryl at home. We have dosed Benadryl, slightly measure, Pepcid, normal saline and we'll monitor closely in the emergency department.  Patient has complete resolution of hives at this time. Denies any trouble breathing. Denies any itching. States he is ready to go home. As he has had resolution of his symptoms will discharge patient home with primary care follow-up. Patient agreeable to plan. He'll be discharged with prednisone, and Benadryl as needed.  ____________________________________________   FINAL CLINICAL IMPRESSION(S) / ED DIAGNOSES   allergic reaction   Harvest Dark, MD 01/04/16 2249  Harvest Dark, MD 01/04/16 [phone]

## 2016-01-04 NOTE — ED Notes (Signed)
Rash and hives improved. Pt states is feeling improved.

## 2016-01-04 NOTE — ED Notes (Signed)
Hives and rash improving. Pt states lightheaded feeling improved.

## 2016-01-04 NOTE — ED Notes (Signed)
No rash or hives noted at this time. Pt up to void in urinal.

## 2016-01-29 ENCOUNTER — Encounter: Payer: Self-pay | Admitting: Primary Care

## 2016-01-29 ENCOUNTER — Ambulatory Visit (INDEPENDENT_AMBULATORY_CARE_PROVIDER_SITE_OTHER): Payer: BLUE CROSS/BLUE SHIELD | Admitting: Primary Care

## 2016-01-29 VITALS — BP 134/78 | HR 72 | Temp 99.0°F | Ht 67.25 in | Wt 226.8 lb

## 2016-01-29 DIAGNOSIS — J069 Acute upper respiratory infection, unspecified: Secondary | ICD-10-CM

## 2016-01-29 NOTE — Patient Instructions (Signed)
Your symptoms are related to a virus that cannot be treated with antibiotics. These symptoms will typically pass in 14 days.  Please call me if you develop fevers of 101, start coughing up green mucous, start to feel worse.  Continue Dayquil as discussed.  Nasal Congestion: Try using Flonase (fluticasone) nasal spray. Instill 2 sprays in each nostril once daily.   Cough/Congestion: Try taking Mucinex DM. This will help loosen up the mucous in your chest. Ensure you take this medication with a full glass of water.  It was a pleasure meeting you!  Upper Respiratory Infection, Adult Most upper respiratory infections (URIs) are a viral infection of the air passages leading to the lungs. A URI affects the nose, throat, and upper air passages. The most common type of URI is nasopharyngitis and is typically referred to as "the common cold." URIs run their course and usually go away on their own. Most of the time, a URI does not require medical attention, but sometimes a bacterial infection in the upper airways can follow a viral infection. This is called a secondary infection. Sinus and middle ear infections are common types of secondary upper respiratory infections. Bacterial pneumonia can also complicate a URI. A URI can worsen asthma and chronic obstructive pulmonary disease (COPD). Sometimes, these complications can require emergency medical care and may be life threatening.  CAUSES Almost all URIs are caused by viruses. A virus is a type of germ and can spread from one person to another.  RISKS FACTORS You may be at risk for a URI if:   You smoke.   You have chronic heart or lung disease.  You have a weakened defense (immune) system.   You are very young or very old.   You have nasal allergies or asthma.  You work in crowded or poorly ventilated areas.  You work in health care facilities or schools. SIGNS AND SYMPTOMS  Symptoms typically develop 2-3 days after you come in contact  with a cold virus. Most viral URIs last 7-10 days. However, viral URIs from the influenza virus (flu virus) can last 14-18 days and are typically more severe. Symptoms may include:   Runny or stuffy (congested) nose.   Sneezing.   Cough.   Sore throat.   Headache.   Fatigue.   Fever.   Loss of appetite.   Pain in your forehead, behind your eyes, and over your cheekbones (sinus pain).  Muscle aches.  DIAGNOSIS  Your health care provider may diagnose a URI by:  Physical exam.  Tests to check that your symptoms are not due to another condition such as:  Strep throat.  Sinusitis.  Pneumonia.  Asthma. TREATMENT  A URI goes away on its own with time. It cannot be cured with medicines, but medicines may be prescribed or recommended to relieve symptoms. Medicines may help:  Reduce your fever.  Reduce your cough.  Relieve nasal congestion. HOME CARE INSTRUCTIONS   Take medicines only as directed by your health care provider.   Gargle warm saltwater or take cough drops to comfort your throat as directed by your health care provider.  Use a warm mist humidifier or inhale steam from a shower to increase air moisture. This may make it easier to breathe.  Drink enough fluid to keep your urine clear or pale yellow.   Eat soups and other clear broths and maintain good nutrition.   Rest as needed.   Return to work when your temperature has returned to normal or  as your health care provider advises. You may need to stay home longer to avoid infecting others. You can also use a face mask and careful hand washing to prevent spread of the virus.  Increase the usage of your inhaler if you have asthma.   Do not use any tobacco products, including cigarettes, chewing tobacco, or electronic cigarettes. If you need help quitting, ask your health care provider. PREVENTION  The best way to protect yourself from getting a cold is to practice good hygiene.   Avoid  oral or hand contact with people with cold symptoms.   Wash your hands often if contact occurs.  There is no clear evidence that vitamin C, vitamin E, echinacea, or exercise reduces the chance of developing a cold. However, it is always recommended to get plenty of rest, exercise, and practice good nutrition.  SEEK MEDICAL CARE IF:   You are getting worse rather than better.   Your symptoms are not controlled by medicine.   You have chills.  You have worsening shortness of breath.  You have brown or red mucus.  You have yellow or brown nasal discharge.  You have pain in your face, especially when you bend forward.  You have a fever.  You have swollen neck glands.  You have pain while swallowing.  You have white areas in the back of your throat. SEEK IMMEDIATE MEDICAL CARE IF:   You have severe or persistent:  Headache.  Ear pain.  Sinus pain.  Chest pain.  You have chronic lung disease and any of the following:  Wheezing.  Prolonged cough.  Coughing up blood.  A change in your usual mucus.  You have a stiff neck.  You have changes in your:  Vision.  Hearing.  Thinking.  Mood. MAKE SURE YOU:   Understand these instructions.  Will watch your condition.  Will get help right away if you are not doing well or get worse.   This information is not intended to replace advice given to you by your health care provider. Make sure you discuss any questions you have with your health care provider.   Document Released: 05/26/2001 Document Revised: 04/16/2015 Document Reviewed: 03/07/2014 Elsevier Interactive Patient Education [employer].

## 2016-01-29 NOTE — Progress Notes (Signed)
Pre visit review using our clinic review tool, if applicable. No additional management support is needed unless otherwise documented below in the visit note. 

## 2016-01-29 NOTE — Progress Notes (Signed)
Subjective:    [name], male    DOB: 09-Jan-1966, 50 y.o.   MRN: [id number]  HPI  Mr. Hildebrandt is a 50 year old male who presents today with a chief complaint of cough. He also reports ear pain, sore throat, chills. His symptoms have been present since Saturday this past weekend. He's been around several people who were ill with the same symptoms. He's taken Nyquil and Dayquil with temporary improvement.   Review of Systems  Constitutional: Positive for fever. Negative for chills and fatigue.  HENT: Positive for congestion, ear pain, sinus pressure and sore throat.   Respiratory: Positive for cough. Negative for shortness of breath.   Cardiovascular: Negative for chest pain.  Gastrointestinal: Negative for nausea.  Musculoskeletal: Positive for myalgias.       Past Medical History  Diagnosis Date  . Hypertension   . GERD (gastroesophageal reflux disease)     Social History   Social History  . Marital Status: Married    Spouse Name: N/A  . Number of Children: N/A  . Years of Education: N/A   Occupational History  . Not on file.   Social History Main Topics  . Smoking status: Never Smoker   . Smokeless tobacco: Current User    Types: Chew  . Alcohol Use: Yes     Comment: rarely  . Drug Use: No  . Sexual Activity: Not on file   Other Topics Concern  . Not on file   Social History Narrative   No regular exercise   5 people living at residence    Past Surgical History  Procedure Laterality Date  . Appendectomy      78 years old  . Cardiac catheterization  2001    clean    Family History  Problem Relation Age of Onset  . Diabetes Mother     No Known Allergies  Current Outpatient Prescriptions on File Prior to Visit  Medication Sig Dispense Refill  . aspirin 81 MG tablet Take 81 mg by mouth daily.      Marland Kitchen esomeprazole (NEXIUM) 40 MG capsule TAKE ONE CAPSULE BY MOUTH EACH MORNING BEFORE BREAKFAST 30 capsule 5  . ibuprofen (ADVIL,MOTRIN)  200 MG tablet Take 200 mg by mouth every 6 (six) hours as needed.    . nadolol (CORGARD) 20 MG tablet Take 1 tablet (20 mg total) by mouth daily. 30 tablet 11  . predniSONE (DELTASONE) 20 MG tablet Take 2 tablets (40 mg total) by mouth daily. (Patient not taking: Reported on 01/29/2016) 10 tablet 0   No current facility-administered medications on file prior to visit.    BP 134/78 mmHg  Pulse 72  Temp(Src) 99 F (37.2 C) (Oral)  Ht 5' 7.25" (1.708 m)  Wt 226 lb 12.8 oz (102.876 kg)  BMI 35.26 kg/m2  SpO2 97%    Objective:   Physical Exam  Constitutional: He appears well-nourished.  HENT:  Right Ear: Tympanic membrane and ear canal normal.  Left Ear: Tympanic membrane and ear canal normal.  Nose: No mucosal edema. Right sinus exhibits maxillary sinus tenderness. Right sinus exhibits no frontal sinus tenderness. Left sinus exhibits maxillary sinus tenderness. Left sinus exhibits no frontal sinus tenderness.  Mouth/Throat: Oropharynx is clear and moist.  Eyes: Conjunctivae are normal.  Neck: Neck supple.  Cardiovascular: Normal rate and regular rhythm.   Pulmonary/Chest: Effort normal and breath sounds normal. He has no wheezes. He has no rales.  Lymphadenopathy:    He has no  cervical adenopathy.  Skin: Skin is warm and dry.          Assessment & Plan:  URI:  Cough, sore throat, chills, low grade fever since Saturday last weekend (4 days). Temporary improvement with OTC's. Exam unremarkable. Lungs clear which is reassuring. Suspect viral involvement at this point and will treat with supportive measures. Mucinex, Dayquil, Nyquil, Flonase, fluids, rest. Return precautions provided.

## 2016-02-03 ENCOUNTER — Ambulatory Visit (INDEPENDENT_AMBULATORY_CARE_PROVIDER_SITE_OTHER): Payer: BLUE CROSS/BLUE SHIELD | Admitting: Family Medicine

## 2016-02-03 ENCOUNTER — Encounter: Payer: Self-pay | Admitting: Family Medicine

## 2016-02-03 VITALS — BP 122/82 | HR 68 | Temp 98.4°F | Wt 225.5 lb

## 2016-02-03 DIAGNOSIS — J069 Acute upper respiratory infection, unspecified: Secondary | ICD-10-CM | POA: Insufficient documentation

## 2016-02-03 MED ORDER — AMOXICILLIN-POT CLAVULANATE 875-125 MG PO TABS: 1.0000 | ORAL_TABLET | Freq: Two times a day (BID) | ORAL | Status: AC

## 2016-02-03 NOTE — Assessment & Plan Note (Signed)
Deteriorated. Given duration and progression of symptoms, will treat for bacterial sinusitis with Augmentin. Continue supportive care as outlined in AVS. Call or return to clinic prn if these symptoms worsen or fail to improve as anticipated. The patient indicates understanding of these issues and agrees with the plan.

## 2016-02-03 NOTE — Progress Notes (Signed)
Subjective:   [name], male    DOB: Mar 23, 1966, 50 y.o.   MRN: [id number]  Edgar Saunders is a pleasant 50 y.o. year old male pt of Dr. Lorelei Pont, new to me, who presents to clinic today with head congestion  on 02/03/2016  HPI:  Scheryl Darter, NP on 2/15 (5 days ago) with complaint of cough, ear pain, sore throat and chills.  Note reviewed. Had multiple sick contacts.  Nyquil and Dayquil only gave temporary relief. Exam was unremarkable and she felt likely viral- advised to add Mucinex, flonase fluids and rest.  Here today because sinus congestion and pressure have worsened.  Now having tooth pain as well.  No fevers or chills.  Not coughing anything up.  He is using flonase as she suggested along with mucinex.  Still taking Nyquil at night.  He is not a smoker.  NKDA.  Current Outpatient Prescriptions on File Prior to Visit  Medication Sig Dispense Refill  . aspirin 81 MG tablet Take 81 mg by mouth daily.      Marland Kitchen esomeprazole (NEXIUM) 40 MG capsule TAKE ONE CAPSULE BY MOUTH EACH MORNING BEFORE BREAKFAST 30 capsule 5  . ibuprofen (ADVIL,MOTRIN) 200 MG tablet Take 200 mg by mouth every 6 (six) hours as needed.    . nadolol (CORGARD) 20 MG tablet Take 1 tablet (20 mg total) by mouth daily. 30 tablet 11   No current facility-administered medications on file prior to visit.    No Known Allergies  Past Medical History  Diagnosis Date  . Hypertension   . GERD (gastroesophageal reflux disease)     Past Surgical History  Procedure Laterality Date  . Appendectomy      58 years old  . Cardiac catheterization  2001    clean    Family History  Problem Relation Age of Onset  . Diabetes Mother     Social History   Social History  . Marital Status: Married    Spouse Name: N/A  . Number of Children: N/A  . Years of Education: N/A   Occupational History  . Not on file.   Social History Main Topics  . Smoking status: Never Smoker   . Smokeless tobacco:  Current User    Types: Chew  . Alcohol Use: 0.0 oz/week    0 Standard drinks or equivalent per week     Comment: rarely  . Drug Use: No  . Sexual Activity: Not on file   Other Topics Concern  . Not on file   Social History Narrative   No regular exercise   5 people living at residence   [facility], [place], Social History, Family History, Medications, and allergies have been reviewed in [facility], and have been updated if relevant.     Review of Systems  Constitutional: Negative for fever and chills.  HENT: Positive for congestion, ear pain, rhinorrhea, sinus pressure and sore throat. Negative for dental problem, drooling, ear discharge, facial swelling, hearing loss, mouth sores, nosebleeds, postnasal drip, sneezing, tinnitus, trouble swallowing and voice change.   Respiratory: Positive for cough. Negative for shortness of breath, wheezing and stridor.   Cardiovascular: Negative.   Genitourinary: Negative.   Musculoskeletal: Negative.   Skin: Negative.   Neurological: Negative.   All other systems reviewed and are negative.      Objective:    BP 122/82 mmHg  Pulse 68  Temp(Src) 98.4 F (36.9 C) (Oral)  Wt 225 lb 8 oz (102.286 kg)  SpO2 98%   Physical Exam  Constitutional: He is oriented to person, place, and time. He appears well-developed and well-nourished. No distress.  HENT:  Right Ear: Hearing and tympanic membrane normal.  Left Ear: Hearing and tympanic membrane normal.  Nose: Rhinorrhea present. No septal deviation or nasal septal hematoma. No epistaxis.  No foreign bodies. Right sinus exhibits frontal sinus tenderness. Left sinus exhibits frontal sinus tenderness.  Mouth/Throat: Posterior oropharyngeal erythema present. No oropharyngeal exudate, posterior oropharyngeal edema or tonsillar abscesses.  Eyes: Conjunctivae are normal.  Neck: Normal range of motion.  Cardiovascular: Normal rate and regular rhythm.   Pulmonary/Chest: Effort normal and breath sounds normal.    Musculoskeletal: Normal range of motion.  Neurological: He is alert and oriented to person, place, and time. No cranial nerve deficit.  Skin: Skin is warm and dry. He is not diaphoretic.  Psychiatric: He has a normal mood and affect. His behavior is normal. Thought content normal.  Nursing note and vitals reviewed.         Assessment & Plan:   Acute upper respiratory infection No Follow-up on file.

## 2016-02-03 NOTE — Progress Notes (Signed)
Pre visit review using our clinic review tool, if applicable. No additional management support is needed unless otherwise documented below in the visit note. 

## 2016-02-03 NOTE — Patient Instructions (Signed)
It was nice to meet you. Take antibiotic as directed.  Drink lots of fluids.    Treat sympotmatically with Mucinex, nasal saline irrigation, and Tylenol/Ibuprofen.  Keep using flonase.  You can use warm compresses.     Call if not improving as expected in 5-7 days.

## 2016-04-11 ENCOUNTER — Other Ambulatory Visit: Payer: Self-pay | Admitting: Family Medicine

## 2016-05-28 ENCOUNTER — Telehealth: Payer: Self-pay | Admitting: Family Medicine

## 2016-05-28 NOTE — Telephone Encounter (Signed)
Please call and schedule CPE with fasting labs prior for Dr. Copland.  

## 2016-06-09 NOTE — Telephone Encounter (Signed)
Left message asking pt to call office  °

## 2016-06-12 ENCOUNTER — Encounter: Payer: Self-pay | Admitting: Family Medicine

## 2016-06-12 NOTE — Telephone Encounter (Signed)
Mailbox full.    Mailed letter.

## 2016-07-15 ENCOUNTER — Other Ambulatory Visit: Payer: Self-pay

## 2016-07-15 MED ORDER — NADOLOL 20 MG PO TABS: ORAL_TABLET | ORAL | 0 refills | Status: DC

## 2016-07-15 NOTE — Telephone Encounter (Signed)
Edgar Saunders request refill nadolol to [facility]; pt has appt on 07/20/16. Refill done per protocol. Edgar Saunders voiced understanding and pt will keep appt.

## 2016-07-20 ENCOUNTER — Ambulatory Visit: Payer: BLUE CROSS/BLUE SHIELD | Admitting: Family Medicine

## 2016-08-29 ENCOUNTER — Other Ambulatory Visit: Payer: Self-pay | Admitting: Family Medicine

## 2016-09-08 ENCOUNTER — Emergency Department (HOSPITAL_COMMUNITY)
Admission: EM | Admit: 2016-09-08 | Discharge: 2016-09-08 | Disposition: A | Discharge status: Home / Self Care | Payer: BLUE CROSS/BLUE SHIELD | Attending: Emergency Medicine | Admitting: Emergency Medicine

## 2016-09-08 ENCOUNTER — Encounter (HOSPITAL_COMMUNITY): Payer: Self-pay | Admitting: Neurology

## 2016-09-08 ENCOUNTER — Emergency Department (HOSPITAL_COMMUNITY): Payer: BLUE CROSS/BLUE SHIELD

## 2016-09-08 DIAGNOSIS — Z7982 Long term (current) use of aspirin: Secondary | ICD-10-CM | POA: Diagnosis not present

## 2016-09-08 DIAGNOSIS — N130 Hydronephrosis with ureteropelvic junction obstruction: Secondary | ICD-10-CM | POA: Diagnosis not present

## 2016-09-08 DIAGNOSIS — I10 Essential (primary) hypertension: Secondary | ICD-10-CM | POA: Insufficient documentation

## 2016-09-08 DIAGNOSIS — R109 Unspecified abdominal pain: Secondary | ICD-10-CM | POA: Diagnosis present

## 2016-09-08 DIAGNOSIS — N201 Calculus of ureter: Secondary | ICD-10-CM | POA: Diagnosis not present

## 2016-09-08 DIAGNOSIS — N200 Calculus of kidney: Secondary | ICD-10-CM

## 2016-09-08 LAB — BASIC METABOLIC PANEL
ANION GAP: 11 (ref 5–15)
BUN: 15 mg/dL (ref 6–20)
CHLORIDE: 105 mmol/L (ref 101–111)
CO2: 21 mmol/L — AB (ref 22–32)
Calcium: 9.5 mg/dL (ref 8.9–10.3)
Creatinine, Ser: 1.27 mg/dL — ABNORMAL HIGH (ref 0.61–1.24)
GFR calc Af Amer: >60 mL/min (ref >60)
GFR calc non Af Amer: >60 mL/min (ref >60)
GLUCOSE: 120 mg/dL — AB (ref 65–99)
POTASSIUM: 4 mmol/L (ref 3.5–5.1)
Sodium: 137 mmol/L (ref 135–145)

## 2016-09-08 LAB — URINE MICROSCOPIC-ADD ON

## 2016-09-08 LAB — CBC
HEMATOCRIT: 44.7 % (ref 39.0–52.0)
Hemoglobin: 15 g/dL (ref 13.0–17.0)
MCH: 30.5 pg (ref 26.0–34.0)
MCHC: 33.6 g/dL (ref 30.0–36.0)
MCV: 90.9 fL (ref 78.0–100.0)
Platelets: 272 10*3/uL (ref 150–400)
RBC: 4.92 MIL/uL (ref 4.22–5.81)
RDW: 13.1 % (ref 11.5–15.5)
WBC: 15.8 10*3/uL — ABNORMAL HIGH (ref 4.0–10.5)

## 2016-09-08 LAB — URINALYSIS, ROUTINE W REFLEX MICROSCOPIC
Bilirubin Urine: NEGATIVE
GLUCOSE, UA: NEGATIVE mg/dL
Ketones, ur: NEGATIVE mg/dL
LEUKOCYTES UA: NEGATIVE
Nitrite: NEGATIVE
PH: 6 (ref 5.0–8.0)
Protein, ur: NEGATIVE mg/dL
SPECIFIC GRAVITY, URINE: 1.026 (ref 1.005–1.030)

## 2016-09-08 MED ORDER — TAMSULOSIN HCL 0.4 MG PO CAPS: 0.4000 mg | ORAL_CAPSULE | Freq: Every day | ORAL | 0 refills | Status: DC

## 2016-09-08 MED ORDER — OXYCODONE-ACETAMINOPHEN 5-325 MG PO TABS
1.0000 | ORAL_TABLET | ORAL | Status: DC | PRN
Start: 2016-09-08 — End: 2016-09-08
  Administered 2016-09-08: 1 via ORAL

## 2016-09-08 MED ORDER — OXYCODONE-ACETAMINOPHEN 5-325 MG PO TABS
ORAL_TABLET | ORAL | Status: AC
  Filled 2016-09-08: qty 1

## 2016-09-08 MED ORDER — OXYCODONE-ACETAMINOPHEN 5-325 MG PO TABS: 1.0000 | ORAL_TABLET | ORAL | 0 refills | Status: DC | PRN

## 2016-09-08 NOTE — ED Triage Notes (Signed)
Pt here with right flank pain since this morning. No history of kidney stones. Reports nausea. Denies hematuria. Pt is restless, uncomfortable. Is a x 4.

## 2016-09-08 NOTE — ED Notes (Signed)
Patient transported to CT 

## 2016-09-08 NOTE — ED Provider Notes (Signed)
[place] DEPT Provider Note   CSN: [id number] Arrival date & time: 09/08/16  1350     History   Chief Complaint Chief Complaint  Patient presents with  . Flank Pain    HPI Edgar Saunders is a 50 y.o. male.  The history is provided by the patient. No language interpreter was used.  Flank Pain    Edgar Saunders is a 50 y.o. male who presents to the Emergency Department complaining of right flank pain.  Pain began at about 11:30 today. Pain is described as a sharp and stabbing right flank pain. He has associated nausea due to pain. He reports dark urine earlier today but now clear. In triage she is given a Percocet and his pain has subsided since then. No fevers, vomiting, diarrhea, dysuria. No numbness or weakness.  Past Medical History:  Diagnosis Date  . GERD (gastroesophageal reflux disease)   . Hypertension     Patient Active Problem List   Diagnosis Date Noted  . Acute upper respiratory infection 02/03/2016  . OBESITY 02/01/2009  . TOBACCO USE 02/01/2009  . Essential hypertension 02/01/2009  . GERD 02/01/2009    Past Surgical History:  Procedure Laterality Date  . APPENDECTOMY     58 years old  . CARDIAC CATHETERIZATION  2001   clean       Home Medications    Prior to Admission medications   Medication Sig Start Date End Date Taking? Authorizing Provider  aspirin 81 MG tablet Take 81 mg by mouth daily.      Historical Provider, MD  esomeprazole (NEXIUM) 40 MG capsule TAKE ONE CAPSULE BY MOUTH EACH MORNING BEFORE BREAKFAST 10/21/15   Owens Loffler, MD  ibuprofen (ADVIL,MOTRIN) 200 MG tablet Take 200 mg by mouth every 6 (six) hours as needed.    Historical Provider, MD  nadolol (CORGARD) 20 MG tablet TAKE 1 TABLET BY MOUTH ONCE A DAY *NEED OFFICE VISIT 08/30/16   Owens Loffler, MD  oxyCODONE-acetaminophen (PERCOCET/ROXICET) 5-325 MG tablet Take 1 tablet by mouth every 4 (four) hours as needed for severe pain. 09/08/16   Quintella Reichert, MD  tamsulosin  (FLOMAX) 0.4 MG CAPS capsule Take 1 capsule (0.4 mg total) by mouth daily. 09/08/16   Quintella Reichert, MD    Family History Family History  Problem Relation Age of Onset  . Diabetes Mother     Social History Social History  Substance Use Topics  . Smoking status: Never Smoker  . Smokeless tobacco: Current User    Types: Chew  . Alcohol use 0.0 oz/week     Comment: rarely     Allergies   Review of patient's allergies indicates no known allergies.   Review of Systems Review of Systems  Genitourinary: Positive for flank pain.  All other systems reviewed and are negative.    Physical Exam Updated Vital Signs BP 135/70 (BP Location: Right Arm)   Pulse (!) 57   Temp 98 F (36.7 C) (Oral)   Resp 18   Ht 5\' 10"  (1.778 m)   Wt 225 lb (102.1 kg)   SpO2 99%   BMI 32.28 kg/m   Physical Exam  Constitutional: He is oriented to person, place, and time. He appears well-developed and well-nourished.  HENT:  Head: Normocephalic and atraumatic.  Cardiovascular: Normal rate and regular rhythm.   No murmur heard. Pulmonary/Chest: Effort normal and breath sounds normal. No respiratory distress.  Abdominal: Soft. There is no tenderness. There is no rebound and no guarding.  No CVA  tenderness  Musculoskeletal: He exhibits no edema or tenderness.  Neurological: He is alert and oriented to person, place, and time.  Skin: Skin is warm and dry.  Psychiatric: He has a normal mood and affect. His behavior is normal.  Nursing note and vitals reviewed.    ED Treatments / Results  Labs (all labs ordered are listed, but only abnormal results are displayed) Labs Reviewed  URINALYSIS, ROUTINE W REFLEX MICROSCOPIC (NOT AT [facility]) - Abnormal; Notable for the following:       Result Value   Hgb urine dipstick LARGE (*)    All other components within normal limits  BASIC METABOLIC PANEL - Abnormal; Notable for the following:    CO2 21 (*)    Glucose, Bld 120 (*)    Creatinine, Ser 1.27  (*)    All other components within normal limits  CBC - Abnormal; Notable for the following:    WBC 15.8 (*)    All other components within normal limits  URINE MICROSCOPIC-ADD ON - Abnormal; Notable for the following:    Squamous Epithelial / LPF 0-5 (*)    Bacteria, UA RARE (*)    All other components within normal limits    EKG  EKG Interpretation None       Radiology Ct Renal Stone Study  Result Date: 09/08/2016 CLINICAL DATA:  Right lower quadrant pain.  Previous appendectomy. EXAM: CT ABDOMEN AND PELVIS WITHOUT CONTRAST TECHNIQUE: Multidetector CT imaging of the abdomen and pelvis was performed following the standard protocol without IV contrast. COMPARISON:  March 27, 2013 FINDINGS: Lower chest: No acute abnormality. Hepatobiliary: Hepatic steatosis. The liver and gallbladder are otherwise normal. Pancreas: Unremarkable. No pancreatic ductal dilatation or surrounding inflammatory changes. Spleen: Minimal low-attenuation in the inferior spleen is likely a cyst or other benign process. No suspicious splenic abnormalities. Adrenals/Urinary Tract: The adrenal glands are normal. The left kidney is normal with no hydronephrosis, stone, mass, ureterectasis, or ureteral stone. No right-sided renal stones. There is moderate hydronephrosis and perinephric stranding however. The right ureter is dilated to the level of the UVJ secondary to a distal 4 mm stone at the UVJ. The bladder is unremarkable. Stomach/Bowel: The stomach and small bowel are within normal limits. The colon demonstrates a few scattered colonic diverticuli with no diverticulitis. The appendix is surgically absent. Vascular/Lymphatic: No significant vascular findings are present. No enlarged abdominal or pelvic lymph nodes. Reproductive: The prostate is enlarged and contains calcifications. Other: No other abnormalities. Musculoskeletal: Degenerative changes seen in the lower lumbar spine. IMPRESSION: 1. Obstructing 4 mm stone at  the right UVJ. Electronically Signed   By: Dorise Bullion III M.D   On: 09/08/2016 19:04    Procedures Procedures (including critical care time)  Medications Ordered in ED Medications  oxyCODONE-acetaminophen (PERCOCET/ROXICET) 5-325 MG per tablet 1 tablet (1 tablet Oral Given 09/08/16 1445)     Initial Impression / Assessment and Plan / ED Course  I have reviewed the triage vital signs and the nursing notes.  Pertinent labs & imaging results that were available during my care of the patient were reviewed by me and considered in my medical decision making (see chart for details).  Clinical Course    Patient here for evaluation of right flank pain. His pain is controlled on evaluation in the emergency department. BMP with mild renal insufficiency. CT Stone study demonstrates a 4 mm stone with no complicating  features. UA is not consistent with UTI. Discussed with patient home care for kidney stone,  outpatient follow-up as well as return precautions.  Final Clinical Impressions(s) / ED Diagnoses   Final diagnoses:  Kidney stone    New Prescriptions New Prescriptions   OXYCODONE-ACETAMINOPHEN (PERCOCET/ROXICET) 5-325 MG TABLET    Take 1 tablet by mouth every 4 (four) hours as needed for severe pain.   TAMSULOSIN (FLOMAX) 0.4 MG CAPS CAPSULE    Take 1 capsule (0.4 mg total) by mouth daily.     Quintella Reichert, MD 09/08/16 [place]

## 2016-09-08 NOTE — Discharge Instructions (Signed)
You have a 39mm stone in your right ureter.  Get rechecked immediately if you develop fever, uncontrolled pain, can't urinate, or new worrisome symptoms.

## 2016-09-09 ENCOUNTER — Encounter (HOSPITAL_COMMUNITY): Payer: Self-pay

## 2016-09-09 ENCOUNTER — Emergency Department (HOSPITAL_COMMUNITY)
Admission: EM | Admit: 2016-09-09 | Discharge: 2016-09-09 | Disposition: A | Discharge status: Home / Self Care | Payer: BLUE CROSS/BLUE SHIELD | Attending: Emergency Medicine | Admitting: Emergency Medicine

## 2016-09-09 DIAGNOSIS — Z7982 Long term (current) use of aspirin: Secondary | ICD-10-CM | POA: Insufficient documentation

## 2016-09-09 DIAGNOSIS — R55 Syncope and collapse: Secondary | ICD-10-CM | POA: Diagnosis not present

## 2016-09-09 DIAGNOSIS — Z79899 Other long term (current) drug therapy: Secondary | ICD-10-CM | POA: Insufficient documentation

## 2016-09-09 DIAGNOSIS — I10 Essential (primary) hypertension: Secondary | ICD-10-CM | POA: Diagnosis not present

## 2016-09-09 LAB — URINE MICROSCOPIC-ADD ON
Bacteria, UA: NONE SEEN
RBC / HPF: NONE SEEN RBC/hpf (ref 0–5)
SQUAMOUS EPITHELIAL / LPF: NONE SEEN

## 2016-09-09 LAB — CBC
HEMATOCRIT: 45.6 % (ref 39.0–52.0)
Hemoglobin: 14.7 g/dL (ref 13.0–17.0)
MCH: 29.7 pg (ref 26.0–34.0)
MCHC: 32.2 g/dL (ref 30.0–36.0)
MCV: 92.1 fL (ref 78.0–100.0)
PLATELETS: 278 10*3/uL (ref 150–400)
RBC: 4.95 MIL/uL (ref 4.22–5.81)
RDW: 13.3 % (ref 11.5–15.5)
WBC: 13.7 10*3/uL — AB (ref 4.0–10.5)

## 2016-09-09 LAB — URINALYSIS, ROUTINE W REFLEX MICROSCOPIC
BILIRUBIN URINE: NEGATIVE
Glucose, UA: NEGATIVE mg/dL
KETONES UR: NEGATIVE mg/dL
LEUKOCYTES UA: NEGATIVE
NITRITE: NEGATIVE
PROTEIN: NEGATIVE mg/dL
Specific Gravity, Urine: 1.004 — ABNORMAL LOW (ref 1.005–1.030)
pH: 5 (ref 5.0–8.0)

## 2016-09-09 LAB — BASIC METABOLIC PANEL
Anion gap: 9 (ref 5–15)
BUN: 12 mg/dL (ref 6–20)
CALCIUM: 9.1 mg/dL (ref 8.9–10.3)
CO2: 27 mmol/L (ref 22–32)
Chloride: 100 mmol/L — ABNORMAL LOW (ref 101–111)
Creatinine, Ser: 1.11 mg/dL (ref 0.61–1.24)
GFR calc Af Amer: >60 mL/min (ref >60)
Glucose, Bld: 109 mg/dL — ABNORMAL HIGH (ref 65–99)
POTASSIUM: 3.8 mmol/L (ref 3.5–5.1)
SODIUM: 136 mmol/L (ref 135–145)

## 2016-09-09 LAB — TROPONIN I

## 2016-09-09 LAB — CBG MONITORING, ED: GLUCOSE-CAPILLARY: 95 mg/dL (ref 65–99)

## 2016-09-09 MED ORDER — SODIUM CHLORIDE 0.9 % IV BOLUS (SEPSIS)
1000.0000 mL | Freq: Once | INTRAVENOUS | Status: AC
  Administered 2016-09-09: 1000 mL via INTRAVENOUS

## 2016-09-09 NOTE — ED Provider Notes (Signed)
[place] DEPT Provider Note   CSN: [id number] Arrival date & time: 09/09/16  0849     History   Chief Complaint Chief Complaint  Patient presents with  . Near Syncope    HPI Edgar Saunders is a 50 y.o. male.  Patient presents from work with near syncopal episode. 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 He went to sit down and then was able to walk to the nurse's station. States they're unable to get a blood pressure until he was given Gatorade to drink. Denies chest pain or shortness of breath. Admits to not eating this morning. Was seen in the ED yesterday for a kidney stone is started on Flomax. He is not have any flank pain now. Denies any diarrhea or vomiting. Denies any headache. Denies any room spinning sensation denies any focal weakness, numbness or tingling. Patient was given Flomax on top of his normal blood pressure medication. He has never passed out before.   The history is provided by the patient and the spouse.  Near Syncope  Pertinent negatives include no chest pain, no abdominal pain, no headaches and no shortness of breath.    Past Medical History:  Diagnosis Date  . GERD (gastroesophageal reflux disease)   . Hypertension     Patient Active Problem List   Diagnosis Date Noted  . Acute upper respiratory infection 02/03/2016  . OBESITY 02/01/2009  . TOBACCO USE 02/01/2009  . Essential hypertension 02/01/2009  . GERD 02/01/2009    Past Surgical History:  Procedure Laterality Date  . APPENDECTOMY     67 years old  . CARDIAC CATHETERIZATION  2001   clean       Home Medications    Prior to Admission medications   Medication Sig Start Date End Date Taking? Authorizing Provider  aspirin 81 MG tablet Take 81 mg by mouth daily.      Historical Provider, MD  esomeprazole (NEXIUM) 40 MG capsule TAKE ONE CAPSULE BY MOUTH EACH MORNING BEFORE  BREAKFAST 10/21/15   Owens Loffler, MD  ibuprofen (ADVIL,MOTRIN) 200 MG tablet Take 200 mg by mouth every 6 (six) hours as needed.    Historical Provider, MD  nadolol (CORGARD) 20 MG tablet TAKE 1 TABLET BY MOUTH ONCE A DAY *NEED OFFICE VISIT 08/30/16   Owens Loffler, MD  oxyCODONE-acetaminophen (PERCOCET/ROXICET) 5-325 MG tablet Take 1 tablet by mouth every 4 (four) hours as needed for severe pain. 09/08/16   Quintella Reichert, MD  tamsulosin (FLOMAX) 0.4 MG CAPS capsule Take 1 capsule (0.4 mg total) by mouth daily. 09/08/16   Quintella Reichert, MD    Family History Family History  Problem Relation Age of Onset  . Diabetes Mother     Social History Social History  Substance Use Topics  . Smoking status: Never Smoker  . Smokeless tobacco: Current User    Types: Chew  . Alcohol use 0.0 oz/week     Comment: rarely     Allergies   Review of patient's allergies indicates no known allergies.   Review of Systems Review of Systems  Constitutional: Positive for activity change, appetite change and fatigue. Negative for fever.  HENT: Negative for congestion and trouble swallowing.   Eyes: Negative for visual disturbance.  Respiratory: Negative for cough, chest tightness and shortness of breath.   Cardiovascular: Positive for near-syncope. Negative for chest pain and leg swelling.  Gastrointestinal: Positive for  nausea. Negative for abdominal pain and vomiting.  Genitourinary: Negative for dysuria and urgency.  Musculoskeletal: Negative for arthralgias, back pain, gait problem and neck pain.  Skin: Negative for pallor and rash.  Neurological: Positive for dizziness and light-headedness. Negative for headaches.  A complete 10 system review of systems was obtained and all systems are negative except as noted in the HPI and PMH.     Physical Exam Updated Vital Signs BP 158/86 (BP Location: Left Arm)   Pulse 68   Temp 98.6 F (37 C) (Oral)   Resp 18   SpO2 100%   Physical Exam    Constitutional: He is oriented to person, place, and time. He appears well-developed and well-nourished. No distress.  HENT:  Head: Normocephalic and atraumatic.  Mouth/Throat: Oropharynx is clear and moist. No oropharyngeal exudate.  Eyes: Conjunctivae and EOM are normal. Pupils are equal, round, and reactive to light.  Neck: Normal range of motion. Neck supple.  No meningismus.  Cardiovascular: Normal rate, regular rhythm, normal heart sounds and intact distal pulses.   No murmur heard. Pulmonary/Chest: Effort normal and breath sounds normal. No respiratory distress.  Abdominal: Soft. There is no tenderness. There is no rebound and no guarding.  Musculoskeletal: Normal range of motion. He exhibits no edema or tenderness.  Neurological: He is alert and oriented to person, place, and time. No cranial nerve deficit. He exhibits normal muscle tone. Coordination normal.  No ataxia on finger to nose bilaterally. No pronator drift. 5/5 strength throughout. CN 2-12 intact.Equal grip strength. Sensation intact.   Skin: Skin is warm.  Psychiatric: He has a normal mood and affect. His behavior is normal.  Nursing note and vitals reviewed.    ED Treatments / Results  Labs (all labs ordered are listed, but only abnormal results are displayed) Labs Reviewed  BASIC METABOLIC PANEL - Abnormal; Notable for the following:       Result Value   Chloride 100 (*)    Glucose, Bld 109 (*)    All other components within normal limits  CBC - Abnormal; Notable for the following:    WBC 13.7 (*)    All other components within normal limits  URINALYSIS, ROUTINE W REFLEX MICROSCOPIC (NOT AT [facility]) - Abnormal; Notable for the following:    Specific Gravity, Urine 1.004 (*)    Hgb urine dipstick SMALL (*)    All other components within normal limits  TROPONIN I  URINE MICROSCOPIC-ADD ON  CBG MONITORING, ED    EKG  EKG Interpretation  Date/Time:  Wednesday September 09 2016 09:03:29 EDT Ventricular  Rate:  68 PR Interval:  196 QRS Duration: 102 QT Interval:  408 QTC Calculation: 433 R Axis:   70 Text Interpretation:  Normal sinus rhythm Normal ECG Normal sinus rhythm Confirmed by Wyvonnia Dusky  MD, Maysie Parkhill ([id number]) on 09/09/2016 9:14:08 AM       Radiology Ct Renal Stone Study  Result Date: 09/08/2016 CLINICAL DATA:  Right lower quadrant pain.  Previous appendectomy. EXAM: CT ABDOMEN AND PELVIS WITHOUT CONTRAST TECHNIQUE: Multidetector CT imaging of the abdomen and pelvis was performed following the standard protocol without IV contrast. COMPARISON:  March 27, 2013 FINDINGS: Lower chest: No acute abnormality. Hepatobiliary: Hepatic steatosis. The liver and gallbladder are otherwise normal. Pancreas: Unremarkable. No pancreatic ductal dilatation or surrounding inflammatory changes. Spleen: Minimal low-attenuation in the inferior spleen is likely a cyst or other benign process. No suspicious splenic abnormalities. Adrenals/Urinary Tract: The adrenal glands are normal. The left kidney is normal with  no hydronephrosis, stone, mass, ureterectasis, or ureteral stone. No right-sided renal stones. There is moderate hydronephrosis and perinephric stranding however. The right ureter is dilated to the level of the UVJ secondary to a distal 4 mm stone at the UVJ. The bladder is unremarkable. Stomach/Bowel: The stomach and small bowel are within normal limits. The colon demonstrates a few scattered colonic diverticuli with no diverticulitis. The appendix is surgically absent. Vascular/Lymphatic: No significant vascular findings are present. No enlarged abdominal or pelvic lymph nodes. Reproductive: The prostate is enlarged and contains calcifications. Other: No other abnormalities. Musculoskeletal: Degenerative changes seen in the lower lumbar spine. IMPRESSION: 1. Obstructing 4 mm stone at the right UVJ. Electronically Signed   By: Dorise Bullion III M.D   On: 09/08/2016 19:04    Procedures Procedures (including  critical care time)  Medications Ordered in ED Medications  sodium chloride 0.9 % bolus 1,000 mL (not administered)     Initial Impression / Assessment and Plan / ED Course  I have reviewed the triage vital signs and the nursing notes.  Pertinent labs & imaging results that were available during my care of the patient were reviewed by me and considered in my medical decision making (see chart for details).  Clinical Course   Near syncopal episode, now improved. Did not eat this morning. No chest pain or shortness of breath. EKG shows no prolonged QT and no Brugada.  Suspect orthostatic hypotension in setting of Flomax use. Advised to discontinue. We'll hydrate in the ED and check labs.  Workup reassuring. Orthostatics negative. Patient tolerating by mouth and ambulatory. Patient given IV and by mouth fluids. Denies any recurrence of dizziness or lightheadedness.  Advised to discontinue Flomax. Follow up with urology. Return precautions discussed. Patient is on a beta blocker but he has been on this for quite some time. He may need to have this discontinued if he continues to have symptoms. Final Clinical Impressions(s) / ED Diagnoses   Final diagnoses:  Near syncope    New Prescriptions New Prescriptions   No medications on file     Ezequiel Essex, MD 09/09/16 1206

## 2016-09-09 NOTE — ED Notes (Signed)
Papers reviewed and leaving with wife. No complaints of pain and no questions about meds or follow up

## 2016-09-09 NOTE — ED Notes (Signed)
CBG 95 

## 2016-09-09 NOTE — ED Triage Notes (Signed)
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 Seen yesterday in ED and started flomax for same and thinks he is now dehydrated, no pain, alert and oriented

## 2016-09-09 NOTE — Discharge Instructions (Signed)
Keep yourself hydrated. Stop taking Flomax. Follow up with your doctor. You may need to have your blood pressure medication adjusted if you continue to be dizzy. Return to ED if worsening symptoms.

## 2016-09-09 NOTE — Discharge Planning (Signed)
[facility] reviewed discharging chart for possible CM needs.  No needs identified.    Follow-up appointment made?n/a  Does patient have transportation Issues? (When/Time?)n/a, left with wife  Medication needs?n/a; pt has insurance and no new Rx prescribed   Equipment Needs?n/a                                      Oxygen Needs?n/a  PT equipment ordered?n/a  H.H needed? n/a                                                H.H ordered?n/a

## 2016-09-09 NOTE — ED Notes (Signed)
Pt ambulated to restroom. Pt tolerated well.  

## 2016-09-09 NOTE — ED Notes (Signed)
Pt. Has been drinking and walking with no problems

## 2016-11-10 ENCOUNTER — Other Ambulatory Visit: Payer: Self-pay | Admitting: Family Medicine

## 2016-11-10 DIAGNOSIS — Z114 Encounter for screening for human immunodeficiency virus [HIV]: Secondary | ICD-10-CM

## 2016-11-10 DIAGNOSIS — Z125 Encounter for screening for malignant neoplasm of prostate: Secondary | ICD-10-CM

## 2016-11-10 DIAGNOSIS — Z0000 Encounter for general adult medical examination without abnormal findings: Secondary | ICD-10-CM

## 2016-11-10 DIAGNOSIS — Z1159 Encounter for screening for other viral diseases: Secondary | ICD-10-CM

## 2016-11-10 DIAGNOSIS — Z13220 Encounter for screening for lipoid disorders: Secondary | ICD-10-CM

## 2016-11-16 ENCOUNTER — Other Ambulatory Visit (INDEPENDENT_AMBULATORY_CARE_PROVIDER_SITE_OTHER): Payer: BLUE CROSS/BLUE SHIELD

## 2016-11-16 DIAGNOSIS — Z125 Encounter for screening for malignant neoplasm of prostate: Secondary | ICD-10-CM | POA: Diagnosis not present

## 2016-11-16 DIAGNOSIS — Z114 Encounter for screening for human immunodeficiency virus [HIV]: Secondary | ICD-10-CM

## 2016-11-16 DIAGNOSIS — Z0000 Encounter for general adult medical examination without abnormal findings: Secondary | ICD-10-CM

## 2016-11-16 DIAGNOSIS — Z1159 Encounter for screening for other viral diseases: Secondary | ICD-10-CM | POA: Diagnosis not present

## 2016-11-16 DIAGNOSIS — R7989 Other specified abnormal findings of blood chemistry: Secondary | ICD-10-CM

## 2016-11-16 DIAGNOSIS — Z13220 Encounter for screening for lipoid disorders: Secondary | ICD-10-CM

## 2016-11-16 LAB — CBC WITH DIFFERENTIAL/PLATELET
Basophils Absolute: 0.1 10*3/uL (ref 0.0–0.1)
Basophils Relative: 0.5 % (ref 0.0–3.0)
EOS PCT: 5.1 % — AB (ref 0.0–5.0)
Eosinophils Absolute: 0.6 10*3/uL (ref 0.0–0.7)
HCT: 46.6 % (ref 39.0–52.0)
Hemoglobin: 15.9 g/dL (ref 13.0–17.0)
LYMPHS ABS: 3.5 10*3/uL (ref 0.7–4.0)
Lymphocytes Relative: 31.7 % (ref 12.0–46.0)
MCHC: 34 g/dL (ref 30.0–36.0)
MCV: 88.9 fl (ref 78.0–100.0)
MONOS PCT: 4.7 % (ref 3.0–12.0)
Monocytes Absolute: 0.5 10*3/uL (ref 0.1–1.0)
NEUTROS PCT: 58 % (ref 43.0–77.0)
Neutro Abs: 6.4 10*3/uL (ref 1.4–7.7)
Platelets: 300 10*3/uL (ref 150.0–400.0)
RBC: 5.24 Mil/uL (ref 4.22–5.81)
RDW: 13.2 % (ref 11.5–15.5)
WBC: 11 10*3/uL — ABNORMAL HIGH (ref 4.0–10.5)

## 2016-11-16 LAB — HEPATIC FUNCTION PANEL
ALT: 32 U/L (ref 0–53)
AST: 25 U/L (ref 0–37)
Albumin: 4.2 g/dL (ref 3.5–5.2)
Alkaline Phosphatase: 87 U/L (ref 39–117)
BILIRUBIN TOTAL: 0.4 mg/dL (ref 0.2–1.2)
Bilirubin, Direct: 0.1 mg/dL (ref 0.0–0.3)
Total Protein: 7 g/dL (ref 6.0–8.3)

## 2016-11-16 LAB — BASIC METABOLIC PANEL
BUN: 12 mg/dL (ref 6–23)
CHLORIDE: 104 meq/L (ref 96–112)
CO2: 30 meq/L (ref 19–32)
Calcium: 9.5 mg/dL (ref 8.4–10.5)
Creatinine, Ser: 1.16 mg/dL (ref 0.40–1.50)
GFR: 70.27 mL/min (ref >60.00)
GLUCOSE: 102 mg/dL — AB (ref 70–99)
POTASSIUM: 4.8 meq/L (ref 3.5–5.1)
SODIUM: 141 meq/L (ref 135–145)

## 2016-11-16 LAB — LIPID PANEL
CHOL/HDL RATIO: 6
Cholesterol: 203 mg/dL — ABNORMAL HIGH (ref 0–200)
HDL: 33.8 mg/dL — AB (ref >39.00)
NONHDL: 169.49
Triglycerides: 231 mg/dL — ABNORMAL HIGH (ref 0.0–149.0)
VLDL: 46.2 mg/dL — AB (ref 0.0–40.0)

## 2016-11-16 LAB — PSA: PSA: 5.65 ng/mL — ABNORMAL HIGH (ref 0.10–4.00)

## 2016-11-16 LAB — LDL CHOLESTEROL, DIRECT: Direct LDL: 115 mg/dL

## 2016-11-17 LAB — HIV ANTIBODY (ROUTINE TESTING W REFLEX): HIV: NONREACTIVE

## 2016-11-17 LAB — HEPATITIS C ANTIBODY: HCV Ab: NEGATIVE

## 2016-11-23 ENCOUNTER — Encounter: Payer: Self-pay | Admitting: Family Medicine

## 2016-11-23 ENCOUNTER — Encounter: Payer: Self-pay | Admitting: Gastroenterology

## 2016-11-23 ENCOUNTER — Ambulatory Visit (INDEPENDENT_AMBULATORY_CARE_PROVIDER_SITE_OTHER): Payer: BLUE CROSS/BLUE SHIELD | Admitting: Family Medicine

## 2016-11-23 VITALS — BP 120/80 | HR 65 | Temp 98.7°F | Ht 67.0 in | Wt 231.5 lb

## 2016-11-23 DIAGNOSIS — L820 Inflamed seborrheic keratosis: Secondary | ICD-10-CM

## 2016-11-23 DIAGNOSIS — Z1211 Encounter for screening for malignant neoplasm of colon: Secondary | ICD-10-CM

## 2016-11-23 DIAGNOSIS — R972 Elevated prostate specific antigen [PSA]: Secondary | ICD-10-CM

## 2016-11-23 DIAGNOSIS — Z0000 Encounter for general adult medical examination without abnormal findings: Secondary | ICD-10-CM | POA: Diagnosis not present

## 2016-11-23 MED ORDER — NADOLOL 20 MG PO TABS: ORAL_TABLET | ORAL | 11 refills | Status: DC

## 2016-11-23 MED ORDER — ESOMEPRAZOLE MAGNESIUM 40 MG PO CPDR: 40.0000 mg | DELAYED_RELEASE_CAPSULE | Freq: Every day | ORAL | 5 refills | Status: DC | PRN

## 2016-11-23 NOTE — Progress Notes (Signed)
Dr. Frederico Hamman T. Zara Wendt, MD, [facility] [place], [address] Phone: [phone] Fax: [phone]  11/23/2016  Patient: Edgar Saunders, MRN: [id number], DOB: Nov 09, 1966, 50 y.o.  Primary Physician:  Owens Loffler, MD   Chief Complaint  Patient presents with  . Annual Exam   Subjective:   Edgar Saunders is a 50 y.o. pleasant patient who presents with the following:  Preventative Health Maintenance Visit:  Health Maintenance Summary Reviewed and updated, unless pt declines services.  Tobacco History Reviewed. Dips. Alcohol: No concerns, no excessive use Exercise Habits: intermittently active STD concerns: no risk or activity to increase risk Drug Use: None Encouraged self-testicular check  Health Maintenance  Topic Date Due  . TETANUS/TDAP  11/19/1983  . COLONOSCOPY  11/18/2014  . INFLUENZA VACCINE  Completed  . Hepatitis C Screening  Completed  . HIV Screening  Completed   Immunization History  Administered Date(s) Administered  . Influenza-Unspecified 09/21/2016   Patient Active Problem List   Diagnosis Date Noted  . OBESITY 02/01/2009  . TOBACCO USE 02/01/2009  . Essential hypertension 02/01/2009  . GERD 02/01/2009   Past Medical History:  Diagnosis Date  . GERD (gastroesophageal reflux disease)   . Hypertension    Past Surgical History:  Procedure Laterality Date  . APPENDECTOMY     1 years old  . CARDIAC CATHETERIZATION  2001   clean   Social History   Social History  . Marital status: Married    Spouse name: N/A  . Number of children: N/A  . Years of education: N/A   Occupational History  . Not on file.   Social History Main Topics  . Smoking status: Never Smoker  . Smokeless tobacco: Current User    Types: Chew  . Alcohol use 0.0 oz/week     Comment: rarely  . Drug use: No  . Sexual activity: Not on file   Other Topics Concern  . Not on file   Social History  Narrative   No regular exercise   5 people living at residence   Family History  Problem Relation Age of Onset  . Diabetes Mother    No Known Allergies  Medication list has been reviewed and updated.   General: Denies fever, chills, sweats. No significant weight loss. Eyes: Denies blurring,significant itching ENT: Denies earache, sore throat, and hoarseness. Cardiovascular: Denies chest pains, palpitations, dyspnea on exertion Respiratory: Denies cough, dyspnea at rest,wheeezing Breast: no concerns about lumps GI: Denies nausea, vomiting, diarrhea, constipation, change in bowel habits, abdominal pain, melena, hematochezia GU: Denies penile discharge, ED, urinary flow / outflow problems. No STD concerns. Musculoskeletal: Denies back pain, joint pain Derm: Denies rash, itching - multiple skin tags and irritated L forehead seb k Neuro: Denies  paresthesias, frequent falls, frequent headaches Psych: Denies depression, anxiety Endocrine: Denies cold intolerance, heat intolerance, polydipsia Heme: Denies enlarged lymph nodes Allergy: No hayfever  Objective:   BP 120/80   Pulse 65   Temp 98.7 F (37.1 C) (Oral)   Ht '5\' 7"'$  (1.702 m)   Wt 231 lb 8 oz (105 kg)   BMI 36.26 kg/m  Ideal Body Weight: Weight in (lb) to have BMI = 25: 159.3  No exam data present  GEN: well developed, well nourished, no acute distress Eyes: conjunctiva and lids normal, PERRLA, EOMI ENT: TM clear, nares clear, oral exam WNL Neck: supple, no lymphadenopathy, no thyromegaly, no JVD Pulm: clear to auscultation and percussion,  respiratory effort normal CV: regular rate and rhythm, S1-S2, no murmur, rub or gallop, no bruits, peripheral pulses normal and symmetric, no cyanosis, clubbing, edema or varicosities GI: soft, non-tender; no hepatosplenomegaly, masses; active bowel sounds all quadrants GU: no hernia, testicular mass, penile discharge Lymph: no cervical, axillary or inguinal adenopathy MSK: gait  normal, muscle tone and strength WNL, no joint swelling, effusions, discoloration, crepitus  SKIN: clear, good turgor, color WNL, no rashes, lesions, or ulcerations SEB K IRRITATED, L FOREHEAD Neuro: normal mental status, normal strength, sensation, and motion Psych: alert; oriented to person, place and time, normally interactive and not anxious or depressed in appearance.  All labs reviewed with patient.  Lipids:    Component Value Date/Time   CHOL 203 (H) 11/16/2016 0832   TRIG 231.0 (H) 11/16/2016 0832   HDL 33.80 (L) 11/16/2016 0832   LDLDIRECT 115.0 11/16/2016 0832   VLDL 46.2 (H) 11/16/2016 0832   CHOLHDL 6 11/16/2016 0832   CBC: CBC Latest Ref Rng & Units 11/16/2016 09/09/2016 09/08/2016  WBC 4.0 - 10.5 K/uL 11.0(H) 13.7(H) 15.8(H)  Hemoglobin 13.0 - 17.0 g/dL 15.9 14.7 15.0  Hematocrit 39.0 - 52.0 % 46.6 45.6 44.7  Platelets 150.0 - 400.0 K/uL 300.0 278 638    Basic Metabolic Panel:    Component Value Date/Time   NA 141 11/16/2016 0832   K 4.8 11/16/2016 0832   CL 104 11/16/2016 0832   CO2 30 11/16/2016 0832   BUN 12 11/16/2016 0832   CREATININE 1.16 11/16/2016 0832   GLUCOSE 102 (H) 11/16/2016 0832   CALCIUM 9.5 11/16/2016 0832   Hepatic Function Latest Ref Rng & Units 11/16/2016 04/25/2010  Total Protein 6.0 - 8.3 g/dL 7.0 6.9  Albumin 3.5 - 5.2 g/dL 4.2 4.1  AST 0 - 37 U/L 25 27  ALT 0 - 53 U/L 32 35  Alk Phosphatase 39 - 117 U/L 87 84  Total Bilirubin 0.2 - 1.2 mg/dL 0.4 0.7  Bilirubin, Direct 0.0 - 0.3 mg/dL 0.1 0.1    No results found for: TSH Lab Results  Component Value Date   PSA 5.65 (H) 11/16/2016    Assessment and Plan:   Healthcare maintenance  Special screening for malignant neoplasms, colon - Plan: Ambulatory referral to Gastroenterology  Elevated PSA - Plan: Ambulatory referral to Urology  Seborrheic keratosis, inflamed  Cryotherapy  Reason: Irritated L forehead Seb 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 Tolerated well without complications.   PSA approaching 6 - consult the patient's urologist.  Health Maintenance Exam: The patient's preventative maintenance and recommended screening tests for an annual wellness exam were reviewed in full today. Brought up to date unless services declined.  Counselled on the importance of diet, exercise, and its role in overall health and mortality. The patient's FH and SH was reviewed, including their home life, tobacco status, and drug and alcohol status.  Follow-up in 1 year for physical exam or additional follow-up below.  Follow-up: No Follow-up on file. Or follow-up in 1 year if not noted.  Meds ordered this encounter  Medications  . DISCONTD: esomeprazole (NEXIUM) 40 MG capsule    Sig: Take 40 mg by mouth daily as needed.  . nadolol (CORGARD) 20 MG tablet    Sig: TAKE 1 TABLET BY MOUTH ONCE A DAY    Dispense:  30 tablet    Refill:  11  .  esomeprazole (NEXIUM) 40 MG capsule    Sig: Take 1 capsule (40 mg total) by mouth daily as needed.    Dispense:  30 capsule    Refill:  5   Medications Discontinued During This Encounter  Medication Reason  . oxyCODONE-acetaminophen (PERCOCET/ROXICET) 5-325 MG tablet Completed Course  . ibuprofen (ADVIL,MOTRIN) 200 MG tablet Completed Course  . esomeprazole (NEXIUM) 40 MG capsule Change in therapy  . nadolol (CORGARD) 20 MG tablet Reorder  . esomeprazole (NEXIUM) 40 MG capsule Reorder   Orders Placed This Encounter  Procedures  . Ambulatory referral to Urology  . Ambulatory referral to Gastroenterology    Signed,  Frederico Hamman T. Mykael Trott, MD     Medication List       Accurate as of 11/23/16  3:18 PM. Always use your most recent med list.          aspirin 81 MG tablet Take 81 mg by mouth daily.   esomeprazole 40 MG capsule Commonly known as:  NEXIUM Take 1  capsule (40 mg total) by mouth daily as needed.   multivitamin tablet Take 1 tablet by mouth daily.   nadolol 20 MG tablet Commonly known as:  CORGARD TAKE 1 TABLET BY MOUTH ONCE A DAY   Vitamin D3 2000 units capsule Take 2,000 Units by mouth daily.

## 2016-11-23 NOTE — Progress Notes (Signed)
Pre visit review using our clinic review tool, if applicable. No additional management support is needed unless otherwise documented below in the visit note. 

## 2016-11-23 NOTE — Patient Instructions (Signed)

## 2016-12-14 HISTORY — PX: COLONOSCOPY: SHX174

## 2017-01-04 DIAGNOSIS — R3912 Poor urinary stream: Secondary | ICD-10-CM | POA: Diagnosis not present

## 2017-01-04 DIAGNOSIS — N201 Calculus of ureter: Secondary | ICD-10-CM | POA: Diagnosis not present

## 2017-01-04 DIAGNOSIS — R972 Elevated prostate specific antigen [PSA]: Secondary | ICD-10-CM | POA: Diagnosis not present

## 2017-01-04 DIAGNOSIS — N401 Enlarged prostate with lower urinary tract symptoms: Secondary | ICD-10-CM | POA: Diagnosis not present

## 2017-01-15 ENCOUNTER — Ambulatory Visit (AMBULATORY_SURGERY_CENTER): Payer: Self-pay

## 2017-01-15 VITALS — Ht 68.5 in | Wt 236.0 lb

## 2017-01-15 DIAGNOSIS — Z1211 Encounter for screening for malignant neoplasm of colon: Secondary | ICD-10-CM

## 2017-01-15 MED ORDER — SUPREP BOWEL PREP KIT 17.5-3.13-1.6 GM/177ML PO SOLN: 1.0000 | Freq: Once | ORAL | 0 refills | Status: AC

## 2017-01-15 NOTE — Progress Notes (Signed)
No allergies to eggs or soy No past problems with anesthesia No diet meds No home oxygen  Registered for emmi 

## 2017-01-19 ENCOUNTER — Encounter: Payer: Self-pay | Admitting: Gastroenterology

## 2017-01-29 ENCOUNTER — Encounter: Payer: Self-pay | Admitting: Gastroenterology

## 2017-01-29 ENCOUNTER — Ambulatory Visit (AMBULATORY_SURGERY_CENTER): Payer: BLUE CROSS/BLUE SHIELD | Admitting: Gastroenterology

## 2017-01-29 VITALS — BP 116/64 | HR 61 | Temp 98.9°F | Resp 18 | Ht 68.5 in | Wt 236.0 lb

## 2017-01-29 DIAGNOSIS — Z1212 Encounter for screening for malignant neoplasm of rectum: Secondary | ICD-10-CM | POA: Diagnosis not present

## 2017-01-29 DIAGNOSIS — Z1211 Encounter for screening for malignant neoplasm of colon: Secondary | ICD-10-CM | POA: Diagnosis not present

## 2017-01-29 DIAGNOSIS — D126 Benign neoplasm of colon, unspecified: Secondary | ICD-10-CM | POA: Diagnosis not present

## 2017-01-29 DIAGNOSIS — D120 Benign neoplasm of cecum: Secondary | ICD-10-CM

## 2017-01-29 DIAGNOSIS — K621 Rectal polyp: Secondary | ICD-10-CM

## 2017-01-29 DIAGNOSIS — K635 Polyp of colon: Secondary | ICD-10-CM | POA: Diagnosis not present

## 2017-01-29 DIAGNOSIS — D122 Benign neoplasm of ascending colon: Secondary | ICD-10-CM | POA: Diagnosis not present

## 2017-01-29 DIAGNOSIS — D124 Benign neoplasm of descending colon: Secondary | ICD-10-CM | POA: Diagnosis not present

## 2017-01-29 DIAGNOSIS — D123 Benign neoplasm of transverse colon: Secondary | ICD-10-CM

## 2017-01-29 DIAGNOSIS — D129 Benign neoplasm of anus and anal canal: Secondary | ICD-10-CM

## 2017-01-29 DIAGNOSIS — D128 Benign neoplasm of rectum: Secondary | ICD-10-CM

## 2017-01-29 MED ORDER — SODIUM CHLORIDE 0.9 % IV SOLN: 500.0000 mL | INTRAVENOUS | Status: DC

## 2017-01-29 NOTE — Progress Notes (Signed)
Pt's states no medical or surgical changes since previsit or office visit. 

## 2017-01-29 NOTE — Progress Notes (Signed)
Called to room to assist during endoscopic procedure.  Patient ID and intended procedure confirmed with present staff. Received instructions for my participation in the procedure from the performing physician.  

## 2017-01-29 NOTE — Patient Instructions (Signed)
Impression/Recommendations:  Polyp handout given to patient. Diverticulosis handout given to patient. Hemorrhoid handout given to patient.  No ibuprofen, Naproxen, or other NSAID drugs for 2 weeks.   Tylenol only until February 13, 2017.  YOU HAD AN ENDOSCOPIC PROCEDURE TODAY AT [facility]:   Refer to the procedure report that was given to you for any specific questions about what was found during the examination.  If the procedure report does not answer your questions, please call your gastroenterologist to clarify.  If you requested that your care partner not be given the details of your procedure findings, then the procedure report has been included in a sealed envelope for you to review at your convenience later.  YOU SHOULD EXPECT: Some feelings of bloating in the abdomen. Passage of more gas than usual.  Walking can help get rid of the air that was put into your GI tract during the procedure and reduce the bloating. If you had a lower endoscopy (such as a colonoscopy or flexible sigmoidoscopy) you may notice spotting of blood in your stool or on the toilet paper. If you underwent a bowel prep for your procedure, you may not have a normal bowel movement for a few days.  Please Note:  You might notice some irritation and congestion in your nose or some drainage.  This is from the oxygen used during your procedure.  There is no need for concern and it should clear up in a day or so.  SYMPTOMS TO REPORT IMMEDIATELY:   Following lower endoscopy (colonoscopy or flexible sigmoidoscopy):  Excessive amounts of blood in the stool  Significant tenderness or worsening of abdominal pains  Swelling of the abdomen that is new, acute  Fever of 100F or higher   For urgent or emergent issues, a gastroenterologist can be reached at any hour by calling [phone].   DIET:  We do recommend a small meal at first, but then you may proceed to your regular diet.  Drink plenty of fluids but  you should avoid alcoholic beverages for 24 hours.  ACTIVITY:  You should plan to take it easy for the rest of today and you should NOT DRIVE or use heavy machinery until tomorrow (because of the sedation medicines used during the test).    FOLLOW UP: Our staff will call the number listed on your records the next business day following your procedure to check on you and address any questions or concerns that you may have regarding the information given to you following your procedure. If we do not reach you, we will leave a message.  However, if you are feeling well and you are not experiencing any problems, there is no need to return our call.  We will assume that you have returned to your regular daily activities without incident.  If any biopsies were taken you will be contacted by phone or by letter within the next 1-3 weeks.  Please call us at [phone] if you have not heard about the biopsies in 3 weeks.    SIGNATURES/CONFIDENTIALITY: You and/or your care partner have signed paperwork which will be entered into your electronic medical record.  These signatures attest to the fact that that the information above on your After Visit Summary has been reviewed and is understood.  Full responsibility of the confidentiality of this discharge information lies with you and/or your care-partner.

## 2017-01-29 NOTE — Op Note (Signed)
[facility] Patient Name: Edgar Saunders Procedure Date: 01/29/2017 10:56 AM MRN: [id number] Endoscopist: Remo Lipps P. Armbruster MD, MD Age: 51 Referring MD:  Date of Birth: 14-Aug-1966 Gender: Male Account #: [id number] Procedure:                Colonoscopy Indications:              Screening for malignant neoplasm in the colon, This                            is the patient's first colonoscopy Medicines:                Monitored Anesthesia Care Procedure:                Pre-Anesthesia Assessment:                           - Prior to the procedure, a History and Physical                            was performed, and patient medications and                            allergies were reviewed. The patient's tolerance of                            previous anesthesia was also reviewed. The risks                            and benefits of the procedure and the sedation                            options and risks were discussed with the patient.                            All questions were answered, and informed consent                            was obtained. Prior Anticoagulants: The patient has                            taken aspirin, last dose was 1 day prior to                            procedure. ASA Grade Assessment: II - A patient                            with mild systemic disease. After reviewing the                            risks and benefits, the patient was deemed in                            satisfactory condition to undergo the procedure.  After obtaining informed consent, the colonoscope                            was passed under direct vision. Throughout the                            procedure, the patient's blood pressure, pulse, and                            oxygen saturations were monitored continuously. The                            Model CF-HQ190L [id number]) scope was introduced                            through the anus  and advanced to the the cecum,                            identified by appendiceal orifice and ileocecal                            valve. The colonoscopy was performed without                            difficulty. The patient tolerated the procedure                            well. The quality of the bowel preparation was                            good. The ileocecal valve, appendiceal orifice, and                            rectum were photographed. Scope In: 11:01:39 AM Scope Out: 11:20:13 AM Scope Withdrawal Time: 0 hours 15 minutes 26 seconds  Total Procedure Duration: 0 hours 18 minutes 34 seconds  Findings:                 The perianal exam findings include non-thrombosed                            external hemorrhoids.                           Two sessile polyps were found in the cecum. The                            polyps were 3 to 6 mm in size. These polyps were                            removed with a cold snare. Resection and retrieval                            were complete.  A 5 mm polyp was found in the ileocecal valve. The                            polyp was sessile. The polyp was removed with a                            cold snare. Resection and retrieval were complete.                           A 4 mm polyp was found in the ascending colon. The                            polyp was sessile. The polyp was removed with a                            cold snare. Resection and retrieval were complete.                           A 6 mm polyp was found in the transverse colon. The                            polyp was sessile. The polyp was removed with a                            cold snare. Resection and retrieval were complete.                           Two sessile polyps were found in the descending                            colon. The polyps were 4 to 6 mm in size. These                            polyps were removed with a cold snare.  Resection                            and retrieval were complete.                           Two sessile polyps were found in the rectum. The                            polyps were 3 to 4 mm in size. These polyps were                            removed with a cold snare. Resection and retrieval                            were complete.                           Scattered small-mouthed diverticula  were found in                            the left colon.                           Internal hemorrhoids were found during retroflexion.                           The exam was otherwise without abnormality. Complications:            No immediate complications. Estimated blood loss:                            Minimal. Estimated Blood Loss:     Estimated blood loss was minimal. Impression:               - Non-thrombosed external hemorrhoids found on                            perianal exam.                           - Two 3 to 6 mm polyps in the cecum, removed with a                            cold snare. Resected and retrieved.                           - One 5 mm polyp at the ileocecal valve, removed                            with a cold snare. Resected and retrieved.                           - One 4 mm polyp in the ascending colon, removed                            with a cold snare. Resected and retrieved.                           - One 6 mm polyp in the transverse colon, removed                            with a cold snare. Resected and retrieved.                           - Two 4 to 6 mm polyps in the descending colon,                            removed with a cold snare. Resected and retrieved.                           - Two 3 to 4 mm polyps in the rectum, removed with  a cold snare. Resected and retrieved.                           - Diverticulosis in the left colon.                           - Internal hemorrhoids.                           - The examination  was otherwise normal. Recommendation:           - Patient has a contact number available for                            emergencies. The signs and symptoms of potential                            delayed complications were discussed with the                            patient. Return to normal activities tomorrow.                            Written discharge instructions were provided to the                            patient.                           - Resume previous diet.                           - Continue present medications.                           - No ibuprofen, naproxen, or other non-steroidal                            anti-inflammatory drugs for 2 weeks after polyp                            removal.                           - Await pathology results.                           - Repeat colonoscopy is recommended for                            surveillance. The colonoscopy date will be                            determined after pathology results from today's                            exam become available for review. Remo Lipps P. Armbruster MD,  MD 01/29/2017 11:26:25 AM This report has been signed electronically.

## 2017-01-29 NOTE — Progress Notes (Signed)
A/ox3 pleased with MAC, report to [employer]

## 2017-02-01 ENCOUNTER — Telehealth: Payer: Self-pay | Admitting: *Deleted

## 2017-02-01 NOTE — Telephone Encounter (Signed)
  Follow up Call-  Call back number 01/29/2017  Post procedure Call Back phone  # [phone]  Permission to leave phone message Yes  Some recent data might be hidden   Mailbox full and cannot accept new messages

## 2017-02-01 NOTE — Telephone Encounter (Signed)
  Follow up mailbox full

## 2017-02-03 ENCOUNTER — Encounter: Payer: Self-pay | Admitting: Gastroenterology

## 2017-02-25 DIAGNOSIS — R3912 Poor urinary stream: Secondary | ICD-10-CM | POA: Diagnosis not present

## 2017-02-25 DIAGNOSIS — N401 Enlarged prostate with lower urinary tract symptoms: Secondary | ICD-10-CM | POA: Diagnosis not present

## 2017-02-25 DIAGNOSIS — R972 Elevated prostate specific antigen [PSA]: Secondary | ICD-10-CM | POA: Diagnosis not present

## 2017-03-10 DIAGNOSIS — R972 Elevated prostate specific antigen [PSA]: Secondary | ICD-10-CM | POA: Diagnosis not present

## 2017-03-10 DIAGNOSIS — N201 Calculus of ureter: Secondary | ICD-10-CM | POA: Diagnosis not present

## 2017-03-10 DIAGNOSIS — N402 Nodular prostate without lower urinary tract symptoms: Secondary | ICD-10-CM | POA: Diagnosis not present

## 2017-03-16 ENCOUNTER — Other Ambulatory Visit: Payer: Self-pay | Admitting: Urology

## 2017-03-16 DIAGNOSIS — R972 Elevated prostate specific antigen [PSA]: Secondary | ICD-10-CM

## 2017-03-19 DIAGNOSIS — K8020 Calculus of gallbladder without cholecystitis without obstruction: Secondary | ICD-10-CM | POA: Diagnosis not present

## 2017-03-19 DIAGNOSIS — N201 Calculus of ureter: Secondary | ICD-10-CM | POA: Diagnosis not present

## 2017-03-26 ENCOUNTER — Ambulatory Visit (HOSPITAL_COMMUNITY)
Admission: RE | Admit: 2017-03-26 | Discharge: 2017-03-26 | Disposition: A | Discharge status: Home / Self Care | Payer: BLUE CROSS/BLUE SHIELD | Source: Ambulatory Visit | Attending: Urology | Admitting: Urology

## 2017-03-26 ENCOUNTER — Other Ambulatory Visit: Payer: Self-pay | Admitting: Urology

## 2017-03-26 DIAGNOSIS — N402 Nodular prostate without lower urinary tract symptoms: Secondary | ICD-10-CM | POA: Diagnosis not present

## 2017-03-26 DIAGNOSIS — T1590XA Foreign body on external eye, part unspecified, unspecified eye, initial encounter: Secondary | ICD-10-CM

## 2017-03-26 DIAGNOSIS — R972 Elevated prostate specific antigen [PSA]: Secondary | ICD-10-CM

## 2017-03-26 DIAGNOSIS — Z01818 Encounter for other preprocedural examination: Secondary | ICD-10-CM | POA: Diagnosis not present

## 2017-03-26 LAB — POCT I-STAT CREATININE: Creatinine, Ser: 1.1 mg/dL (ref 0.61–1.24)

## 2017-03-26 MED ORDER — LIDOCAINE HCL 2 % EX GEL: 1.0000 "application " | Freq: Once | CUTANEOUS | Status: DC

## 2017-03-26 MED ORDER — GADOBENATE DIMEGLUMINE 529 MG/ML IV SOLN
20.0000 mL | Freq: Once | INTRAVENOUS | Status: AC | PRN
  Administered 2017-03-26: 20 mL via INTRAVENOUS

## 2017-03-26 MED ORDER — LIDOCAINE HCL 2 % EX GEL
CUTANEOUS | Status: AC
  Filled 2017-03-26: qty 30

## 2017-05-05 ENCOUNTER — Ambulatory Visit (INDEPENDENT_AMBULATORY_CARE_PROVIDER_SITE_OTHER): Payer: BLUE CROSS/BLUE SHIELD | Admitting: Family Medicine

## 2017-05-05 ENCOUNTER — Encounter: Payer: Self-pay | Admitting: Family Medicine

## 2017-05-05 VITALS — BP 140/70 | HR 79 | Temp 98.3°F | Ht 67.0 in | Wt 239.0 lb

## 2017-05-05 DIAGNOSIS — H10023 Other mucopurulent conjunctivitis, bilateral: Secondary | ICD-10-CM | POA: Diagnosis not present

## 2017-05-05 MED ORDER — POLYMYXIN B-TRIMETHOPRIM 10000-0.1 UNIT/ML-% OP SOLN: 1.0000 [drp] | OPHTHALMIC | 0 refills | Status: AC
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
## 2017-07-06 ENCOUNTER — Telehealth: Payer: Self-pay | Admitting: Family Medicine

## 2017-07-06 DIAGNOSIS — R972 Elevated prostate specific antigen [PSA]: Secondary | ICD-10-CM | POA: Diagnosis not present

## 2017-07-06 DIAGNOSIS — N401 Enlarged prostate with lower urinary tract symptoms: Secondary | ICD-10-CM | POA: Diagnosis not present

## 2017-07-06 NOTE — Telephone Encounter (Signed)
Pt has appt with Dr Diona Browner on 07/07/17 at 4 PM.

## 2017-07-06 NOTE — Telephone Encounter (Signed)
Patient Name: Edgar Saunders  DOB: Jul 04, 1966    Initial Comment Caller states he has a hemorrhoid. It has bleed for a while today after a bowel movement.   Nurse Assessment  Nurse: Leilani Merl, RN, Heather Date/Time (Eastern Time): 07/06/2017 12:30:39 PM  Confirm and document reason for call. If symptomatic, describe symptoms. ---Caller states he has a hemorrhoid. It has bleed for a while today after a bowel movement.  Does the patient have any new or worsening symptoms? ---Yes  Will a triage be completed? ---Yes  Related visit to physician within the last 2 weeks? ---No  Does the PT have any chronic conditions? (i.e. diabetes, asthma, etc.) ---Yes  List chronic conditions. ---See MR  Is this a behavioral health or substance abuse call? ---No     Guidelines    Guideline Title Affirmed Question Affirmed Notes  Rectal Bleeding MILD rectal bleeding (more than just a few drops or streaks)    Final Disposition User   See PCP When Office is Open (within 3 days) Standifer, RN, [employer]    Comments  Appt tomorrow with Dr. Diona Browner at 4.   Referrals  REFERRED TO PCP OFFICE   Disagree/Comply: Comply

## 2017-07-07 ENCOUNTER — Ambulatory Visit: Payer: Self-pay | Admitting: Family Medicine

## 2017-07-07 ENCOUNTER — Ambulatory Visit: Payer: BLUE CROSS/BLUE SHIELD | Admitting: Internal Medicine

## 2017-07-07 DIAGNOSIS — Z0289 Encounter for other administrative examinations: Secondary | ICD-10-CM

## 2017-07-13 DIAGNOSIS — N401 Enlarged prostate with lower urinary tract symptoms: Secondary | ICD-10-CM | POA: Diagnosis not present

## 2017-07-13 DIAGNOSIS — R972 Elevated prostate specific antigen [PSA]: Secondary | ICD-10-CM | POA: Diagnosis not present

## 2017-07-13 DIAGNOSIS — R3912 Poor urinary stream: Secondary | ICD-10-CM | POA: Diagnosis not present

## 2017-11-30 ENCOUNTER — Encounter: Payer: Self-pay | Admitting: Internal Medicine

## 2017-11-30 ENCOUNTER — Ambulatory Visit: Payer: BLUE CROSS/BLUE SHIELD | Admitting: Internal Medicine

## 2017-11-30 VITALS — BP 126/84 | HR 68 | Temp 97.9°F | Wt 251.0 lb

## 2017-11-30 DIAGNOSIS — H6123 Impacted cerumen, bilateral: Secondary | ICD-10-CM | POA: Diagnosis not present

## 2017-11-30 NOTE — Patient Instructions (Signed)
Earwax Buildup, Adult The ears produce a substance called earwax that helps keep bacteria out of the ear and protects the skin in the ear canal. Occasionally, earwax can build up in the ear and cause discomfort or hearing loss. What increases the risk? This condition is more likely to develop in people who:  Are male.  Are elderly.  Naturally produce more earwax.  Clean their ears often with cotton swabs.  Use earplugs often.  Use in-ear headphones often.  Wear hearing aids.  Have narrow ear canals.  Have earwax that is overly thick or sticky.  Have eczema.  Are dehydrated.  Have excess hair in the ear canal.  What are the signs or symptoms? Symptoms of this condition include:  Reduced or muffled hearing.  A feeling of fullness in the ear or feeling that the ear is plugged.  Fluid coming from the ear.  Ear pain.  Ear itch.  Ringing in the ear.  Coughing.  An obvious piece of earwax that can be seen inside the ear canal.  How is this diagnosed? This condition may be diagnosed based on:  Your symptoms.  Your medical history.  An ear exam. During the exam, your health care provider will look into your ear with an instrument called an otoscope.  You may have tests, including a hearing test. How is this treated? This condition may be treated by:  Using ear drops to soften the earwax.  Having the earwax removed by a health care provider. The health care provider may: ? Flush the ear with water. ? Use an instrument that has a loop on the end (curette). ? Use a suction device.  Surgery to remove the wax buildup. This may be done in severe cases.  Follow these instructions at home:  Take over-the-counter and prescription medicines only as told by your health care provider.  Do not put any objects, including cotton swabs, into your ear. You can clean the opening of your ear canal with a washcloth or facial tissue.  Follow instructions from your health  care provider about cleaning your ears. Do not over-clean your ears.  Drink enough fluid to keep your urine clear or pale yellow. This will help to thin the earwax.  Keep all follow-up visits as told by your health care provider. If earwax builds up in your ears often or if you use hearing aids, consider seeing your health care provider for routine, preventive ear cleanings. Ask your health care provider how often you should schedule your cleanings.  If you have hearing aids, clean them according to instructions from the manufacturer and your health care provider. Contact a health care provider if:  You have ear pain.  You develop a fever.  You have blood, pus, or other fluid coming from your ear.  You have hearing loss.  You have ringing in your ears that does not go away.  Your symptoms do not improve with treatment.  You feel like the room is spinning (vertigo). Summary  Earwax can build up in the ear and cause discomfort or hearing loss.  The most common symptoms of this condition include reduced or muffled hearing and a feeling of fullness in the ear or feeling that the ear is plugged.  This condition may be diagnosed based on your symptoms, your medical history, and an ear exam.  This condition may be treated by using ear drops to soften the earwax or by having the earwax removed by a health care provider.  Do   not put any objects, including cotton swabs, into your ear. You can clean the opening of your ear canal with a washcloth or facial tissue. This information is not intended to replace advice given to you by your health care provider. Make sure you discuss any questions you have with your health care provider. Document Released: 01/07/2005 Document Revised: 02/10/2017 Document Reviewed: 02/10/2017 Elsevier Interactive Patient Education  2018 [employer]vier Inc[employer]  

## 2017-11-30 NOTE — Progress Notes (Signed)
Subjective:    [name], male    DOB: 23-Nov-1966, 51 y.o.   MRN: [id number]  HPI  Pt presents to the clinic today with c/o cerumen impaction. He denies pain or loss of hearing. He reports he has to get his ears cleaned out at least once a year.  Review of Systems      Past Medical History:  Diagnosis Date  . GERD (gastroesophageal reflux disease)   . Hypertension     Current Outpatient Medications  Medication Sig Dispense Refill  . alfuzosin (UROXATRAL) 10 MG 24 hr tablet Take 10 mg by mouth daily with breakfast.    . aspirin 81 MG tablet Take 81 mg by mouth daily.      . Cholecalciferol (VITAMIN D3) 2000 units capsule Take 2,000 Units by mouth daily.    Marland Kitchen esomeprazole (NEXIUM) 40 MG capsule Take 1 capsule (40 mg total) by mouth daily as needed. 30 capsule 5  . Multiple Vitamin (MULTIVITAMIN) tablet Take 1 tablet by mouth daily.    . nadolol (CORGARD) 20 MG tablet TAKE 1 TABLET BY MOUTH ONCE A DAY 30 tablet 11   Current Facility-Administered Medications  Medication Dose Route Frequency Provider Last Rate Last Dose  . 0.9 %  sodium chloride infusion  500 mL Intravenous Continuous Armbruster, Carlota Raspberry, MD        Allergies  Allergen Reactions  . Ivp Dye [Iodinated Diagnostic Agents] Rash    Family History  Problem Relation Age of Onset  . Diabetes Mother   . Colon cancer Paternal Grandmother     Social History   Socioeconomic History  . Marital status: Married    Spouse name: Not on file  . Number of children: Not on file  . Years of education: Not on file  . Highest education level: Not on file  Social Needs  . Financial resource strain: Not on file  . Food insecurity - worry: Not on file  . Food insecurity - inability: Not on file  . Transportation needs - medical: Not on file  . Transportation needs - non-medical: Not on file  Occupational History  . Not on file  Tobacco Use  . Smoking status: Never Smoker  . Smokeless tobacco: Current  User    Types: Chew  Substance and Sexual Activity  . Alcohol use: Yes    Alcohol/week: 0.0 oz    Comment: once every two weeks  . Drug use: No  . Sexual activity: Not on file  Other Topics Concern  . Not on file  Social History Narrative   No regular exercise   5 people living at residence     Constitutional: Denies fever, malaise, fatigue, headache or abrupt weight changes.  HEENT: Pt reports wax buildup. Denies eye pain, eye redness, ear pain, ringing in the ears, runny nose, nasal congestion, bloody nose, or sore throat.   No other specific complaints in a complete review of systems (except as listed in HPI above).  Objective:   Physical Exam   BP 126/84   Pulse 68   Temp 97.9 F (36.6 C) (Oral)   Wt 251 lb (113.9 kg)   SpO2 98%   BMI 39.31 kg/m  Wt Readings from Last 3 Encounters:  11/30/17 251 lb (113.9 kg)  05/05/17 239 lb (108.4 kg)  01/29/17 236 lb (107 kg)    General: Appears his stated age, well developed, well nourished in NAD. HEENT: Ears: bilateral cerumen impaction.  BMET  Component Value Date/Time   NA 141 11/16/2016 0832   K 4.8 11/16/2016 0832   CL 104 11/16/2016 0832   CO2 30 11/16/2016 0832   GLUCOSE 102 (H) 11/16/2016 0832   BUN 12 11/16/2016 0832   CREATININE 1.10 03/26/2017 1038   CALCIUM 9.5 11/16/2016 0832   GFRNONAA >60 09/09/2016 0941   GFRAA >60 09/09/2016 0941    Lipid Panel     Component Value Date/Time   CHOL 203 (H) 11/16/2016 0832   TRIG 231.0 (H) 11/16/2016 0832   HDL 33.80 (L) 11/16/2016 0832   CHOLHDL 6 11/16/2016 0832   VLDL 46.2 (H) 11/16/2016 0832    CBC    Component Value Date/Time   WBC 11.0 (H) 11/16/2016 0832   RBC 5.24 11/16/2016 0832   HGB 15.9 11/16/2016 0832   HCT 46.6 11/16/2016 0832   PLT 300.0 11/16/2016 0832   MCV 88.9 11/16/2016 0832   MCH 29.7 09/09/2016 0941   MCHC 34.0 11/16/2016 0832   RDW 13.2 11/16/2016 0832   LYMPHSABS 3.5 11/16/2016 0832   MONOABS 0.5 11/16/2016 0832    EOSABS 0.6 11/16/2016 0832   BASOSABS 0.1 11/16/2016 0832    Hgb A1C No results found for: HGBA1C        Assessment & Plan:   Cerumen Impaction:  Manual lavage by CMA Advised him to try Debrox 2 x week to prevent wax buildup  Return precautions discussed Webb Silversmith, NP

## 2017-12-13 ENCOUNTER — Other Ambulatory Visit: Payer: Self-pay | Admitting: Family Medicine

## 2018-02-01 DIAGNOSIS — R972 Elevated prostate specific antigen [PSA]: Secondary | ICD-10-CM | POA: Diagnosis not present

## 2018-02-08 DIAGNOSIS — R3912 Poor urinary stream: Secondary | ICD-10-CM | POA: Diagnosis not present

## 2018-02-08 DIAGNOSIS — N401 Enlarged prostate with lower urinary tract symptoms: Secondary | ICD-10-CM | POA: Diagnosis not present

## 2018-02-08 DIAGNOSIS — R972 Elevated prostate specific antigen [PSA]: Secondary | ICD-10-CM | POA: Diagnosis not present

## 2018-03-15 ENCOUNTER — Other Ambulatory Visit: Payer: Self-pay | Admitting: Family Medicine

## 2018-03-15 NOTE — Telephone Encounter (Signed)
Last office visit 11/30/2017 with R. Baity for impacted cerumen.  Last CPE 11/23/2016.  Last refilled 12/15/17 for #30 with 2 refills with note that states per MD needs to call office and schedule annual physical.  No future appointments.  Refill?

## 2018-03-18 ENCOUNTER — Encounter: Payer: Self-pay | Admitting: Family Medicine

## 2018-03-21 ENCOUNTER — Other Ambulatory Visit: Payer: Self-pay | Admitting: Family Medicine

## 2018-03-26 IMAGING — CT CT RENAL STONE PROTOCOL
2 of 4 series · 10 of 46 positions shown, 11 images · non-contrast
Comparison: March 27, 2013

CLINICAL DATA: Right lower quadrant pain.  Previous appendectomy.

EXAM:
CT ABDOMEN AND PELVIS WITHOUT CONTRAST
TECHNIQUE: Multidetector CT imaging of the abdomen and pelvis was performed
following the standard protocol without IV contrast.

[Series 201: stone study, idose (2) · axial · 0.78mm/px · z∈[+267,+687]mm · 7 of 104 slices shown, 8 images]
[im 10/104  soft-tissue]
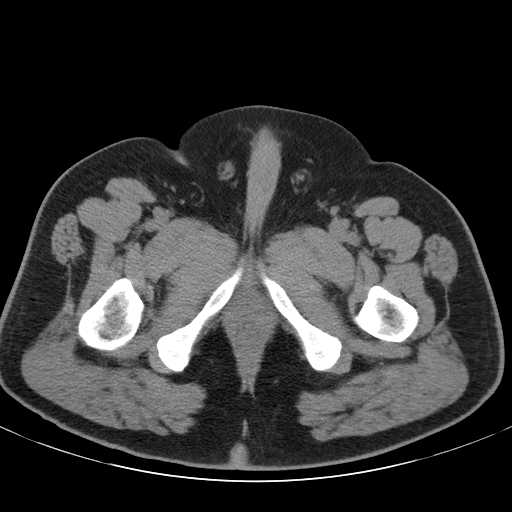
[im 10/104  bone]
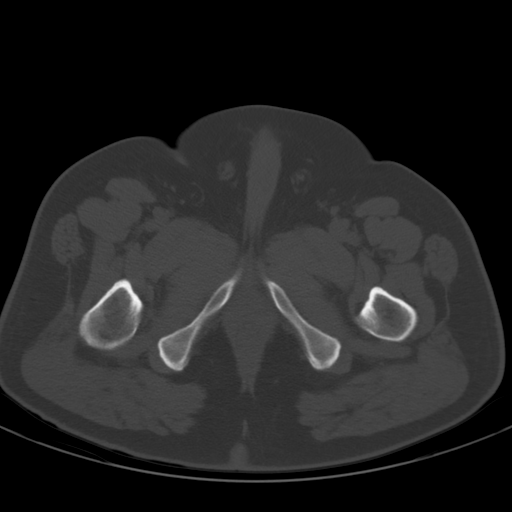
[im 24/104  soft-tissue]
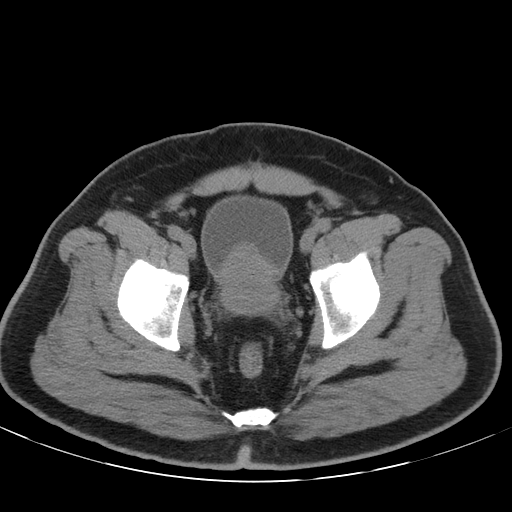
[im 38/104  soft-tissue]
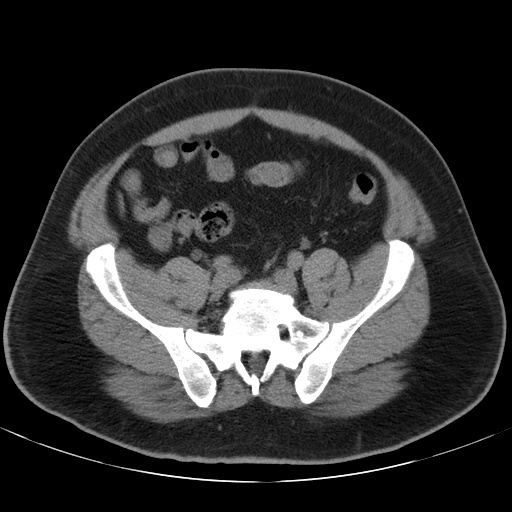
[im 52/104  soft-tissue]
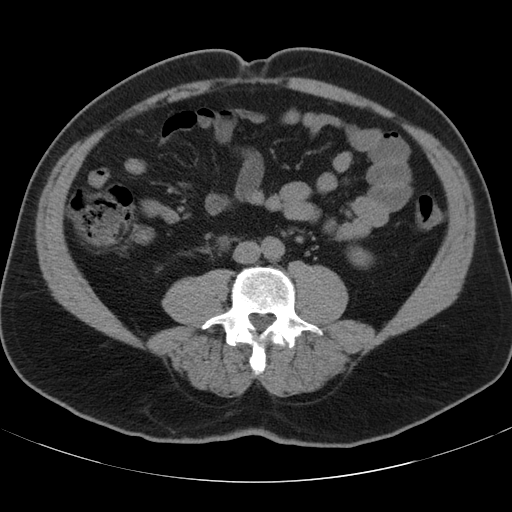
[im 66/104  soft-tissue]
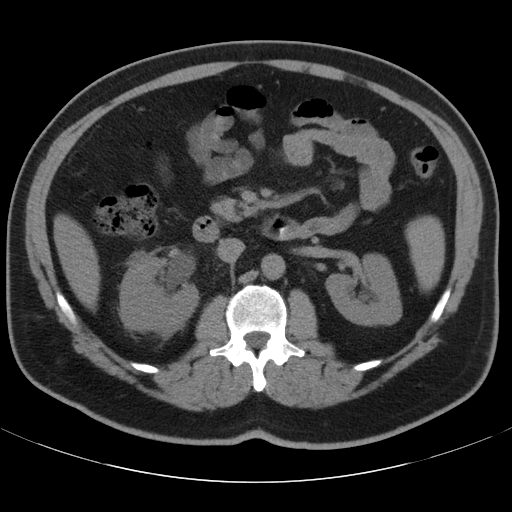
[im 80/104  soft-tissue]
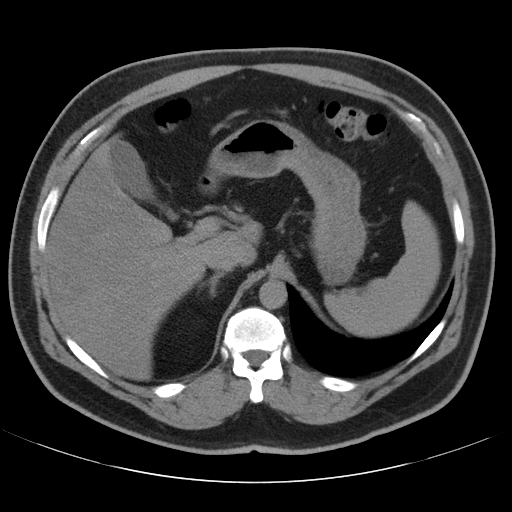
[im 94/104  soft-tissue]
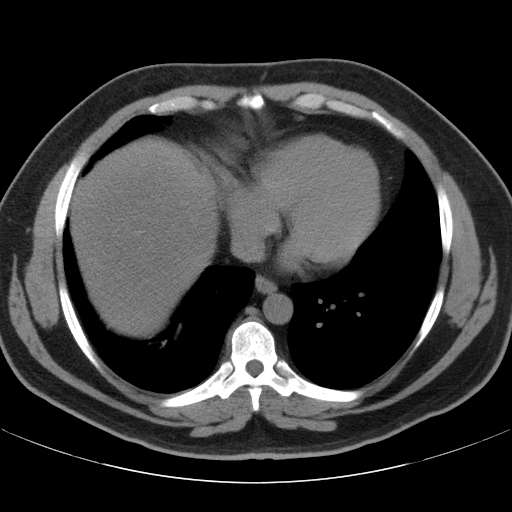

[Series 203: coronals, idose (2) · coronal · 0.45mm/px · 3 of 140 slices shown]
[im 47/140  soft-tissue]
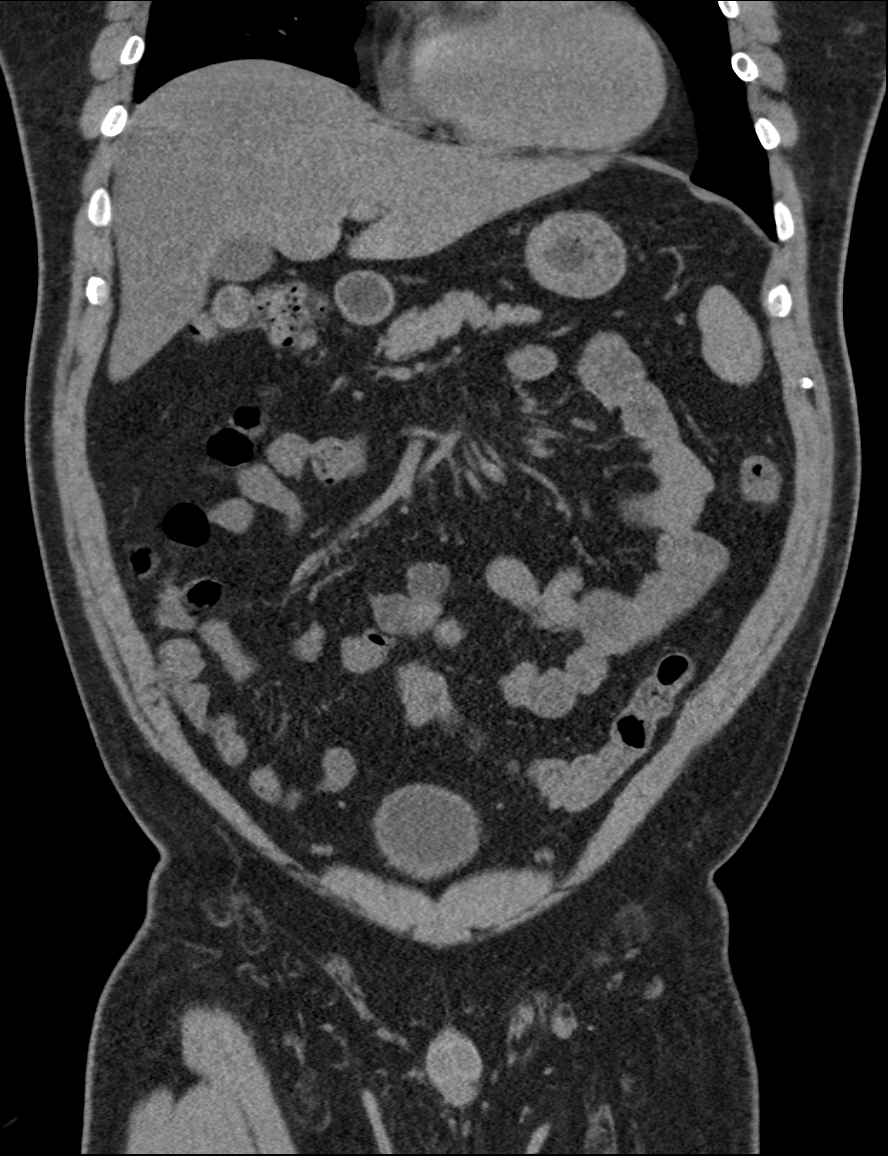
[im 62/140  soft-tissue]
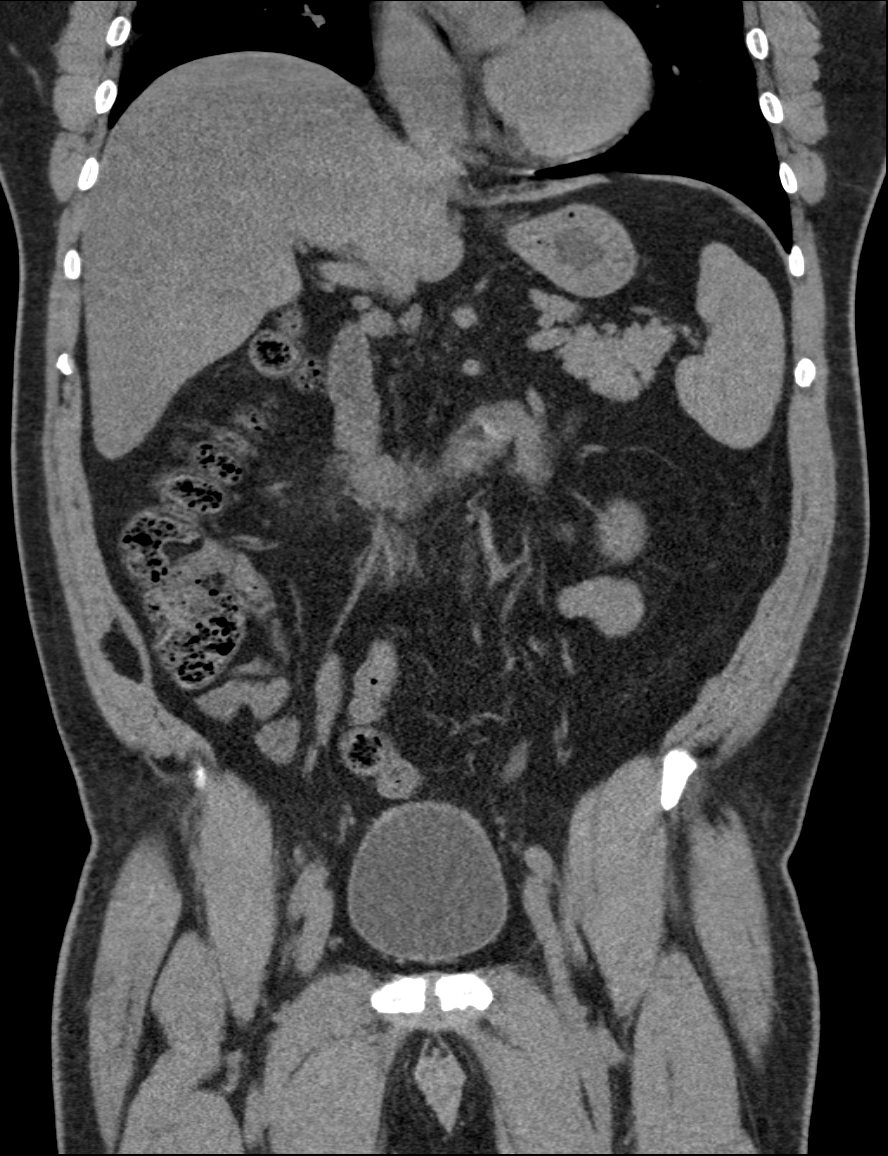
[im 78/140  soft-tissue]
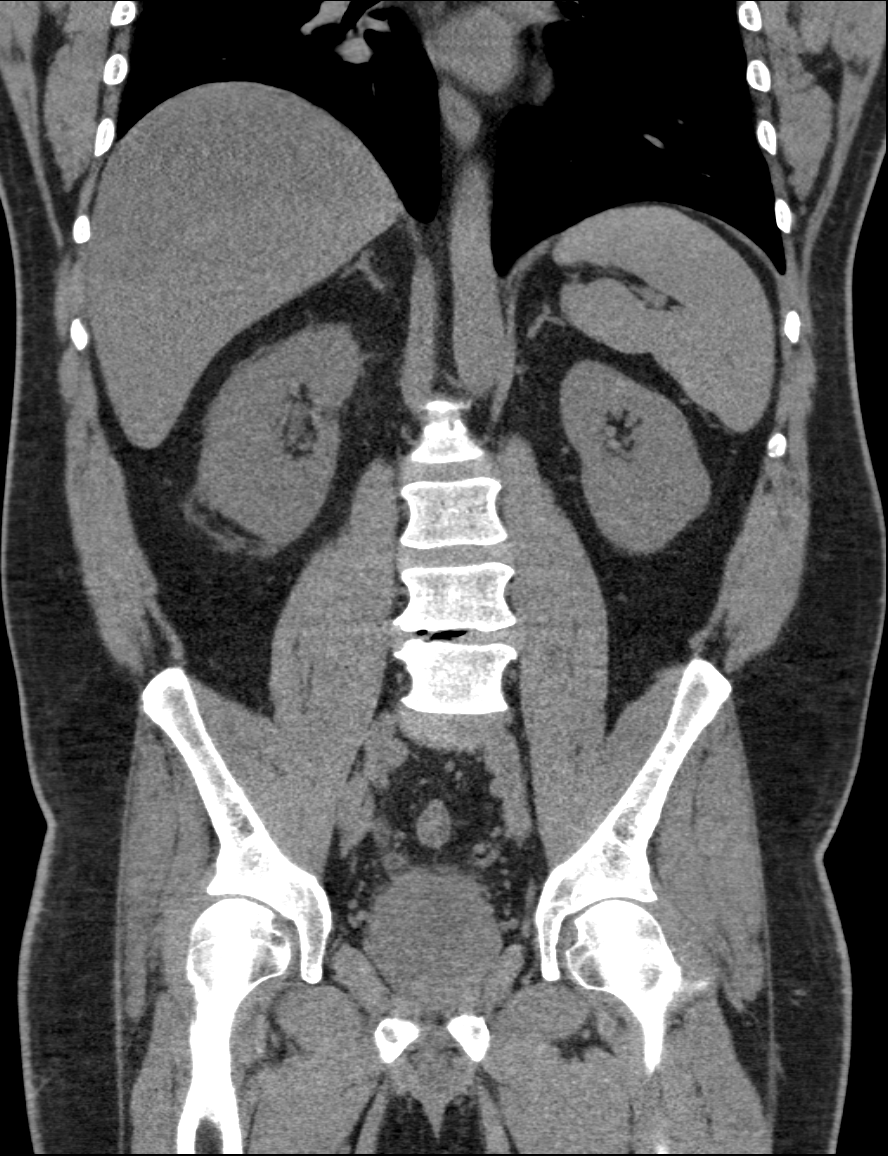

[10 of 46 positions shown; findings below may reference images not displayed]

FINDINGS: Lower chest: No acute abnormality.

Hepatobiliary: Hepatic steatosis. The liver and gallbladder are
otherwise normal.

Pancreas: Unremarkable. No pancreatic ductal dilatation or
surrounding inflammatory changes.

Spleen: Minimal low-attenuation in the inferior spleen is likely a
cyst or other benign process. No suspicious splenic abnormalities.

Adrenals/Urinary Tract: The adrenal glands are normal. The left
kidney is normal with no hydronephrosis, stone, mass, ureterectasis,
or ureteral stone. No right-sided renal stones. There is moderate
hydronephrosis and perinephric stranding however. The right ureter
is dilated to the level of the UVJ secondary to a distal 4 mm stone
at the UVJ. The bladder is unremarkable.

Stomach/Bowel: The stomach and small bowel are within normal limits.
The colon demonstrates a few scattered colonic diverticuli with no
diverticulitis. The appendix is surgically absent.

Vascular/Lymphatic: No significant vascular findings are present. No
enlarged abdominal or pelvic lymph nodes.

Reproductive: The prostate is enlarged and contains calcifications.

Other: No other abnormalities.

Musculoskeletal: Degenerative changes seen in the lower lumbar
spine.
IMPRESSION: 1. Obstructing 4 mm stone at the right UVJ.

## 2018-05-04 ENCOUNTER — Other Ambulatory Visit: Payer: Self-pay | Admitting: Family Medicine

## 2018-05-12 ENCOUNTER — Encounter: Payer: Self-pay | Admitting: Family Medicine

## 2018-05-13 ENCOUNTER — Other Ambulatory Visit: Payer: Self-pay | Admitting: Family Medicine

## 2018-05-13 ENCOUNTER — Other Ambulatory Visit: Payer: BLUE CROSS/BLUE SHIELD

## 2018-05-13 DIAGNOSIS — Z0000 Encounter for general adult medical examination without abnormal findings: Secondary | ICD-10-CM

## 2018-05-18 ENCOUNTER — Encounter: Payer: BLUE CROSS/BLUE SHIELD | Admitting: Family Medicine

## 2018-05-19 ENCOUNTER — Other Ambulatory Visit (INDEPENDENT_AMBULATORY_CARE_PROVIDER_SITE_OTHER): Payer: BLUE CROSS/BLUE SHIELD

## 2018-05-19 DIAGNOSIS — Z0000 Encounter for general adult medical examination without abnormal findings: Secondary | ICD-10-CM | POA: Diagnosis not present

## 2018-05-19 LAB — CBC WITH DIFFERENTIAL/PLATELET
Basophils Absolute: 0.1 10*3/uL (ref 0.0–0.1)
Basophils Relative: 1.1 % (ref 0.0–3.0)
Eosinophils Absolute: 0.6 10*3/uL (ref 0.0–0.7)
Eosinophils Relative: 6 % — ABNORMAL HIGH (ref 0.0–5.0)
HCT: 44.2 % (ref 39.0–52.0)
Hemoglobin: 15.2 g/dL (ref 13.0–17.0)
LYMPHS ABS: 2.8 10*3/uL (ref 0.7–4.0)
Lymphocytes Relative: 29 % (ref 12.0–46.0)
MCHC: 34.3 g/dL (ref 30.0–36.0)
MCV: 88.8 fl (ref 78.0–100.0)
MONO ABS: 0.6 10*3/uL (ref 0.1–1.0)
MONOS PCT: 6.1 % (ref 3.0–12.0)
NEUTROS PCT: 57.8 % (ref 43.0–77.0)
Neutro Abs: 5.6 10*3/uL (ref 1.4–7.7)
Platelets: 275 10*3/uL (ref 150.0–400.0)
RBC: 4.99 Mil/uL (ref 4.22–5.81)
RDW: 13.5 % (ref 11.5–15.5)
WBC: 9.7 10*3/uL (ref 4.0–10.5)

## 2018-05-19 LAB — LIPID PANEL
CHOLESTEROL: 205 mg/dL — AB (ref 0–200)
HDL: 29.6 mg/dL — AB (ref >39.00)
NonHDL: 175.1
Total CHOL/HDL Ratio: 7
Triglycerides: 329 mg/dL — ABNORMAL HIGH (ref 0.0–149.0)
VLDL: 65.8 mg/dL — AB (ref 0.0–40.0)

## 2018-05-19 LAB — HEPATIC FUNCTION PANEL
ALBUMIN: 4.1 g/dL (ref 3.5–5.2)
ALK PHOS: 82 U/L (ref 39–117)
ALT: 26 U/L (ref 0–53)
AST: 23 U/L (ref 0–37)
Bilirubin, Direct: 0.1 mg/dL (ref 0.0–0.3)
Total Bilirubin: 0.5 mg/dL (ref 0.2–1.2)
Total Protein: 6.9 g/dL (ref 6.0–8.3)

## 2018-05-19 LAB — BASIC METABOLIC PANEL
BUN: 13 mg/dL (ref 6–23)
CALCIUM: 9.3 mg/dL (ref 8.4–10.5)
CO2: 30 meq/L (ref 19–32)
Chloride: 103 mEq/L (ref 96–112)
Creatinine, Ser: 1.02 mg/dL (ref 0.40–1.50)
GFR: 81.05 mL/min (ref >60.00)
Glucose, Bld: 102 mg/dL — ABNORMAL HIGH (ref 70–99)
Potassium: 4.4 mEq/L (ref 3.5–5.1)
SODIUM: 140 meq/L (ref 135–145)

## 2018-05-19 LAB — LDL CHOLESTEROL, DIRECT: LDL DIRECT: 123 mg/dL

## 2018-05-19 LAB — PSA: PSA: 5.37 ng/mL — ABNORMAL HIGH (ref 0.10–4.00)

## 2018-05-23 ENCOUNTER — Other Ambulatory Visit: Payer: Self-pay

## 2018-05-23 ENCOUNTER — Ambulatory Visit (INDEPENDENT_AMBULATORY_CARE_PROVIDER_SITE_OTHER): Payer: BLUE CROSS/BLUE SHIELD | Admitting: Family Medicine

## 2018-05-23 ENCOUNTER — Encounter: Payer: Self-pay | Admitting: Family Medicine

## 2018-05-23 VITALS — BP 120/84 | HR 69 | Temp 97.5°F | Ht 67.25 in | Wt 249.0 lb

## 2018-05-23 DIAGNOSIS — Z0000 Encounter for general adult medical examination without abnormal findings: Secondary | ICD-10-CM
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
## 2018-06-05 DIAGNOSIS — R300 Dysuria: Secondary | ICD-10-CM | POA: Diagnosis not present

## 2018-06-05 DIAGNOSIS — N390 Urinary tract infection, site not specified: Secondary | ICD-10-CM | POA: Diagnosis not present

## 2018-06-05 DIAGNOSIS — R81 Glycosuria: Secondary | ICD-10-CM | POA: Diagnosis not present

## 2018-06-07 ENCOUNTER — Other Ambulatory Visit: Payer: Self-pay | Admitting: Family Medicine

## 2018-06-23 ENCOUNTER — Other Ambulatory Visit: Payer: Self-pay | Admitting: Family Medicine

## 2018-07-13 ENCOUNTER — Ambulatory Visit: Payer: BLUE CROSS/BLUE SHIELD | Admitting: Internal Medicine

## 2018-07-13 ENCOUNTER — Encounter: Payer: Self-pay | Admitting: Internal Medicine

## 2018-07-13 VITALS — BP 132/84 | HR 78 | Temp 99.2°F | Wt 249.0 lb

## 2018-07-13 DIAGNOSIS — N3000 Acute cystitis without hematuria: Secondary | ICD-10-CM | POA: Diagnosis not present

## 2018-07-13 DIAGNOSIS — R300 Dysuria: Secondary | ICD-10-CM | POA: Diagnosis not present

## 2018-07-13 DIAGNOSIS — R350 Frequency of micturition: Secondary | ICD-10-CM

## 2018-07-13 LAB — POC URINALSYSI DIPSTICK (AUTOMATED)
Bilirubin, UA: NEGATIVE
Glucose, UA: NEGATIVE
KETONES UA: NEGATIVE
Nitrite, UA: POSITIVE
PROTEIN UA: POSITIVE — AB
SPEC GRAV UA: 1.02 (ref 1.010–1.025)
UROBILINOGEN UA: 0.2 U/dL
pH, UA: 6 (ref 5.0–8.0)

## 2018-07-13 MED ORDER — CIPROFLOXACIN HCL 500 MG PO TABS: 500.0000 mg | ORAL_TABLET | Freq: Two times a day (BID) | ORAL | 0 refills | Status: DC

## 2018-07-13 NOTE — Progress Notes (Signed)
HPI  Pt presents to the clinic today with c/o urinary frequency and dysuria. He reports this started 4 days ago. He denies urgency, blood in his urine. He denies penile discharge or testicular pain. He has not taken anything OTC for his symptoms.  He was recently treated for a UTI 6/23 by UC. He was treated with 7 days of Macrobid. No urine culture was sent. He has had 2-3 UTI's in the last few years, no prostate infectoins. He reports history of BPH, he follows with Dr. Junious Silk.   Review of Systems  Past Medical History:  Diagnosis Date  . GERD (gastroesophageal reflux disease)   . Hypertension     Family History  Problem Relation Age of Onset  . Diabetes Mother   . Colon cancer Paternal Grandmother     Social History   Socioeconomic History  . Marital status: Married    Spouse name: Not on file  . Number of children: Not on file  . Years of education: Not on file  . Highest education level: Not on file  Occupational History  . Not on file  Social Needs  . Financial resource strain: Not on file  . Food insecurity:    Worry: Not on file    Inability: Not on file  . Transportation needs:    Medical: Not on file    Non-medical: Not on file  Tobacco Use  . Smoking status: Never Smoker  . Smokeless tobacco: Current User    Types: Chew  Substance and Sexual Activity  . Alcohol use: Yes    Alcohol/week: 0.0 oz    Comment: once every two weeks  . Drug use: No  . Sexual activity: Not on file  Lifestyle  . Physical activity:    Days per week: Not on file    Minutes per session: Not on file  . Stress: Not on file  Relationships  . Social connections:    Talks on phone: Not on file    Gets together: Not on file    Attends religious service: Not on file    Active member of club or organization: Not on file    Attends meetings of clubs or organizations: Not on file    Relationship status: Not on file  . Intimate partner violence:    Fear of current or ex partner: Not  on file    Emotionally abused: Not on file    Physically abused: Not on file    Forced sexual activity: Not on file  Other Topics Concern  . Not on file  Social History Narrative   No regular exercise   5 people living at residence    Allergies  Allergen Reactions  . Cefdinir Nausea Only  . Ivp Dye [Iodinated Diagnostic Agents] Rash     Constitutional: Denies fever, malaise, fatigue, headache or abrupt weight changes.   GU: Pt reports frequency and pain with urination. Denies urgency, burning sensation, blood in urine, odor or discharge. Skin: Denies redness, rashes, lesions or ulcercations.   No other specific complaints in a complete review of systems (except as listed in HPI above).    Objective:   Physical Exam  Wt 249 lb (112.9 kg)   BMI 38.71 kg/m  Wt Readings from Last 3 Encounters:  07/13/18 249 lb (112.9 kg)  05/23/18 249 lb (112.9 kg)  11/30/17 251 lb (113.9 kg)    General: Appears her stated age, well developed, well nourished in NAD. Abdomen: Soft. Normal bowel sounds. No distention  or masses noted.  Tender to palpation over the bladder area. No CVA tenderness.        Assessment & Plan:    Frequency, Dysuria secondary to UTI:  Urinalysis: 3+ leuks, pos nitirites 2+ blood Will send urine culture eRx sent if for Cipro 500 mg BID x 7 days OK to take AZO OTC Drink plenty of fluids Follow up with urology on [place]  RTC as needed or if symptoms persist. Webb Silversmith, NP

## 2018-07-13 NOTE — Addendum Note (Signed)
Addended by: Lurlean Nanny on: 07/13/2018 04:36 PM   Modules accepted: Orders

## 2018-07-13 NOTE — Patient Instructions (Signed)
Urinary Tract Infection, Adult °A urinary tract infection (UTI) is an infection of any part of the urinary tract. The urinary tract includes the: °· Kidneys. °· Ureters. °· Bladder. °· Urethra. ° °These organs make, store, and get rid of pee (urine) in the body. °Follow these instructions at home: °· Take over-the-counter and prescription medicines only as told by your doctor. °· If you were prescribed an antibiotic medicine, take it as told by your doctor. Do not stop taking the antibiotic even if you start to feel better. °· Avoid the following drinks: °? Alcohol. °? Caffeine. °? Tea. °? Carbonated drinks. °· Drink enough fluid to keep your pee clear or pale yellow. °· Keep all follow-up visits as told by your doctor. This is important. °· Make sure to: °? Empty your bladder often and completely. Do not to hold pee for long periods of time. °? Empty your bladder before and after sex. °? Wipe from front to back after a bowel movement if you are male. Use each tissue one time when you wipe. °Contact a doctor if: °· You have back pain. °· You have a fever. °· You feel sick to your stomach (nauseous). °· You throw up (vomit). °· Your symptoms do not get better after 3 days. °· Your symptoms go away and then come back. °Get help right away if: °· You have very bad back pain. °· You have very bad lower belly (abdominal) pain. °· You are throwing up and cannot keep down any medicines or water. °This information is not intended to replace advice given to you by your health care provider. Make sure you discuss any questions you have with your health care provider. °Document Released: 05/18/2008 Document Revised: 05/07/2016 Document Reviewed: 10/21/2015 °Elsevier Interactive Patient Education © 2018 Elsevier Inc. ° °

## 2018-07-15 LAB — URINE CULTURE
MICRO NUMBER: 90905529
SPECIMEN QUALITY:: ADEQUATE

## 2018-07-18 DIAGNOSIS — N3000 Acute cystitis without hematuria: Secondary | ICD-10-CM | POA: Diagnosis not present

## 2018-07-18 DIAGNOSIS — R972 Elevated prostate specific antigen [PSA]: Secondary | ICD-10-CM | POA: Diagnosis not present

## 2018-08-24 DIAGNOSIS — R972 Elevated prostate specific antigen [PSA]: Secondary | ICD-10-CM | POA: Diagnosis not present

## 2018-08-31 DIAGNOSIS — R3912 Poor urinary stream: Secondary | ICD-10-CM | POA: Diagnosis not present

## 2018-08-31 DIAGNOSIS — N401 Enlarged prostate with lower urinary tract symptoms: Secondary | ICD-10-CM | POA: Diagnosis not present

## 2018-08-31 DIAGNOSIS — R972 Elevated prostate specific antigen [PSA]: Secondary | ICD-10-CM | POA: Diagnosis not present

## 2018-10-20 DIAGNOSIS — R310 Gross hematuria: Secondary | ICD-10-CM | POA: Diagnosis not present

## 2018-10-20 DIAGNOSIS — R8271 Bacteriuria: Secondary | ICD-10-CM | POA: Diagnosis not present

## 2018-11-07 ENCOUNTER — Encounter: Payer: Self-pay | Admitting: Family Medicine

## 2018-11-07 ENCOUNTER — Ambulatory Visit: Payer: BLUE CROSS/BLUE SHIELD | Admitting: Family Medicine

## 2018-11-07 VITALS — BP 164/88 | HR 76 | Temp 98.6°F | Resp 12 | Ht 67.25 in | Wt 249.5 lb

## 2018-11-07 DIAGNOSIS — I10 Essential (primary) hypertension: Secondary | ICD-10-CM

## 2018-11-07 DIAGNOSIS — J00 Acute nasopharyngitis [common cold]: Secondary | ICD-10-CM

## 2018-11-07 NOTE — Assessment & Plan Note (Signed)
BP high in setting of using nasal decongestants. Recommend cont home medicine and monitor at home. Return if elevated once off decongestants and return for re-check in 1 month

## 2018-11-07 NOTE — Patient Instructions (Signed)

## 2018-11-07 NOTE — Progress Notes (Signed)
Subjective:     Edgar Saunders is a 52 y.o. male presenting for Nasal Congestion (Symptoms started on 11/01/18. Nasal congestion, post nasal drainage, cough from drainage. No fever now but maybe some when symptoms first started. Has been using OTC nasal spray, DayQul and Nyquil)              URI   This is a new problem. The current episode started in the past 7 days. The problem has been unchanged. There has been no fever. The fever has been present for less than 1 day. Associated symptoms include congestion, coughing, rhinorrhea, sneezing and a sore throat. Pertinent negatives include no abdominal pain, ear pain, headaches, nausea, plugged ear sensation, sinus pain, swollen glands, vomiting or wheezing. He has tried decongestant, acetaminophen and increased fluids for the symptoms. The treatment provided mild relief.   Has not tried saline rinse Has been using afrin  Review of Systems  HENT: Positive for congestion, rhinorrhea, sneezing and sore throat. Negative for ear pain and sinus pain.   Respiratory: Positive for cough. Negative for wheezing.   Gastrointestinal: Negative for abdominal pain, nausea and vomiting.  Neurological: Negative for headaches.     Social History   Tobacco Use  Smoking Status Never Smoker  Smokeless Tobacco Current User  . Types: Chew        Objective:    BP Readings from Last 3 Encounters:  11/07/18 (!) 164/88  07/13/18 132/84  05/23/18 120/84   Wt Readings from Last 3 Encounters:  11/07/18 249 lb 8 oz (113.2 kg)  07/13/18 249 lb (112.9 kg)  05/23/18 249 lb (112.9 kg)    BP (!) 164/88   Pulse 76   Temp 98.6 F (37 C)   Resp 12   Ht 5' 7.25" (1.708 m)   Wt 249 lb 8 oz (113.2 kg)   SpO2 98%   BMI 38.79 kg/m    Physical Exam  Constitutional: He appears well-developed and well-nourished. He does not appear ill. No distress.  HENT:  Head: Normocephalic and atraumatic.  Right Ear: Ear canal normal.  Left Ear: Ear canal  normal.  Nose: Nose normal. Right sinus exhibits no maxillary sinus tenderness and no frontal sinus tenderness. Left sinus exhibits no maxillary sinus tenderness and no frontal sinus tenderness.  Mouth/Throat: Uvula is midline and mucous membranes are normal. Posterior oropharyngeal erythema present. No oropharyngeal exudate or posterior oropharyngeal edema. Tonsils are 0 on the right. Tonsils are 0 on the left.  Eyes: EOM are normal.  Neck: Neck supple.  Cardiovascular: Normal rate and regular rhythm.  No murmur heard. Pulmonary/Chest: Effort normal and breath sounds normal. No respiratory distress.  Lymphadenopathy:    He has no cervical adenopathy.  Neurological: He is alert.  Skin: Skin is warm and dry. Capillary refill takes less than 2 seconds.  Psychiatric: He has a normal mood and affect.          Assessment & Plan:   Problem List Items Addressed This Visit      Cardiovascular and Mediastinum   Essential hypertension    BP high in setting of using nasal decongestants. Recommend cont home medicine and monitor at home. Return if elevated once off decongestants and return for re-check in 1 month        Other Visit Diagnoses    Acute nasopharyngitis    -  Primary     Patient Instructions  Based on your symptoms, it looks like you have a virus.  Antibiotics are not need for a viral infection but the following will help:   1. Drink plenty of fluids 2. Get lots of rest  Sinus Congestion 1) Neti Pot (Saline rinse) -- 2 times day -- if tolerated 2) Flonase (Store Brand ok) - once daily 3) Over the counter congestion medications  Cough 1) Cough drops can be helpful 2) Nyquil (or nighttime cough medication) 3) Honey is proven to be one of the best cough medications   Sore Throat 1) Honey as above, cough drops 2) Ibuprofen or Aleve can be helpful 3) Salt water Gargles  If you develop fevers (Temperature >100.4), chills, worsening symptoms or symptoms lasting  longer than 10 days return to clinic.        Return in about 4 weeks (around 12/05/2018) for HTN follow-up.  Lesleigh Noe, MD

## 2019-06-08 ENCOUNTER — Telehealth: Payer: Self-pay | Admitting: Family Medicine

## 2019-06-08 NOTE — Telephone Encounter (Signed)
Pt reports exposure to friend who tested positive for covid. States exposure was at funeral, states "No one was wearing a mask" including pt. Reports 2 people at funeral have been tested as they are having symptoms.   Pt requesting test if appropriate. 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 Assured [place] would route to practice for Dr. Lillie Fragmin review.  Please advise: [phone]   Pt states wife. Les Longmore is patient of [facility]. [facility], also with possible exposure , requesting testing.

## 2019-06-09 ENCOUNTER — Encounter: Payer: Self-pay | Admitting: Family Medicine

## 2019-06-09 ENCOUNTER — Ambulatory Visit (INDEPENDENT_AMBULATORY_CARE_PROVIDER_SITE_OTHER): Payer: BC Managed Care – PPO | Admitting: Family Medicine

## 2019-06-09 VITALS — Temp 97.9°F | Ht 67.25 in | Wt 244.1 lb

## 2019-06-09 DIAGNOSIS — Z7189 Other specified counseling: Secondary | ICD-10-CM | POA: Diagnosis not present

## 2019-06-09 NOTE — Assessment & Plan Note (Signed)
Does not have known direct exposure.  Advised continue monitoring as up to now (daily temperature check, monitoring for new symptoms) and as long as asymptomatic ok to continue going to work. To let us know if multiple positive cases from funeral as that may change recommendation. Letter provided today.

## 2019-06-09 NOTE — Telephone Encounter (Signed)
Unable to reach pt by phone. Unable to leave v/m requesting cb due to mailbox being full.

## 2019-06-09 NOTE — Progress Notes (Signed)
Virtual visit completed through [employer]. Due to national recommendations of social distancing due to COVID-19, a virtual visit is felt to be most appropriate for this patient at this time. Reviewed limitations of a virtual visit.   Patient location: home Provider location: [place] [facility], office If any vitals were documented, they were collected by patient at home unless specified below.    Temp 97.9 F (36.6 C)    Ht 5' 7.25" (1.708 m)    Wt 244 lb 2 oz (110.7 kg)    BMI 37.95 kg/m    CC: Covid19 concerns Subjective:    [name], male    DOB: 1965/12/22, 53 y.o.   MRN: [id number]  HPI: Edgar Saunders is a 53 y.o. male presenting on 06/09/2019 for COVID-19 Concerns (Pt has been in contact with friend #2 that has since started having sxs and tested for COVID-19 on 06/07/19.  Friend # 2 was in contact with friend #1 who tested positive on 06/03/19. Pt denies any sxs, but was told to contact doctor per job protocol.   )   Martin Majestic to uncle's funeral Tuesday evening.  Ill family member tested positive earlier in the week, but she did not go to funeral. However, there were other family members who were exposed to her who did go to the funeral. These other family members started feeling ill after the funeral, tests pending. He was not really in close contact with anyone during the funeral but wonders if he should be tested.  Denies any Covid symptoms at this time.  Requests letter for work.      Relevant past medical, surgical, family and social history reviewed and updated as indicated. Interim medical history since our last visit reviewed. Allergies and medications reviewed and updated. Outpatient Medications Prior to Visit  Medication Sig Dispense Refill   alfuzosin (UROXATRAL) 10 MG 24 hr tablet Take 10 mg by mouth daily with breakfast.     aspirin 81 MG tablet Take 81 mg by mouth daily.       esomeprazole (NEXIUM) 40 MG capsule TAKE 1 CAPSULE BY MOUTH EVERY  DAY AS NEEDED. 90 capsule 3   Multiple Vitamin (MULTIVITAMIN) tablet Take 1 tablet by mouth daily.     nadolol (CORGARD) 20 MG tablet Take 1 tablet (20 mg total) by mouth daily. 90 tablet 3   Facility-Administered Medications Prior to Visit  Medication Dose Route Frequency Provider Last Rate Last Dose   0.9 %  sodium chloride infusion  500 mL Intravenous Continuous Armbruster, Carlota Raspberry, MD         Per HPI unless specifically indicated in ROS section below Review of Systems Objective:    Temp 97.9 F (36.6 C)    Ht 5' 7.25" (1.708 m)    Wt 244 lb 2 oz (110.7 kg)    BMI 37.95 kg/m   Wt Readings from Last 3 Encounters:  06/09/19 244 lb 2 oz (110.7 kg)  11/07/18 249 lb 8 oz (113.2 kg)  07/13/18 249 lb (112.9 kg)     Physical exam: Gen: alert, NAD, not ill appearing Pulm: speaks in complete sentences without increased work of breathing Psych: normal mood, normal thought content      Assessment & Plan:   Problem List Items Addressed This Visit    Advice Given About Covid-19 Virus Infection - Primary    Does not have known direct exposure.  Advised continue monitoring as up to now (daily temperature check, monitoring for  new symptoms) and as long as asymptomatic ok to continue going to work. To let us know if multiple positive cases from funeral as that may change recommendation. Letter provided today.           No orders of the defined types were placed in this encounter.  No orders of the defined types were placed in this encounter.   I discussed the assessment and treatment plan with the patient. The patient was provided an opportunity to ask questions and all were answered. The patient agreed with the plan and demonstrated an understanding of the instructions. The patient was advised to call back or seek an in-person evaluation if the symptoms worsen or if the condition fails to improve as anticipated.  Follow up plan: No follow-ups on file.  Edgar Bush, MD

## 2019-06-09 NOTE — Telephone Encounter (Signed)
Seen today. 

## 2019-06-09 NOTE — Telephone Encounter (Signed)
Pt is concerned about being exposed to covid at a funeral on 06/06/19; no one wore a mask. Pt scheduled virtual app today at 11:30 with Dr Darnell Level. Pt  Has no covid symptoms, no travel. FYI to Dr Darnell Level.

## 2019-06-21 ENCOUNTER — Telehealth: Payer: Self-pay | Admitting: Family Medicine

## 2019-06-21 NOTE — Telephone Encounter (Signed)
Please schedule CPE with fasting labs prior with Dr. Copland.  

## 2019-06-23 NOTE — Telephone Encounter (Signed)
Left message asking pt to call office  °

## 2019-07-04 NOTE — Telephone Encounter (Signed)
Patient scheduled 10/26 for CPE with labs prior

## 2019-07-04 NOTE — Telephone Encounter (Signed)
Left message asking  pt to call office Start looking on or after 10/5

## 2019-08-09 ENCOUNTER — Other Ambulatory Visit: Payer: Self-pay | Admitting: Family Medicine

## 2019-09-15 ENCOUNTER — Other Ambulatory Visit: Payer: Self-pay | Admitting: Family Medicine

## 2019-09-26 ENCOUNTER — Other Ambulatory Visit: Payer: Self-pay | Admitting: Family Medicine

## 2019-09-26 DIAGNOSIS — Z0000 Encounter for general adult medical examination without abnormal findings: Secondary | ICD-10-CM

## 2019-09-26 DIAGNOSIS — Z131 Encounter for screening for diabetes mellitus: Secondary | ICD-10-CM

## 2019-10-03 ENCOUNTER — Other Ambulatory Visit (INDEPENDENT_AMBULATORY_CARE_PROVIDER_SITE_OTHER): Payer: BC Managed Care – PPO

## 2019-10-03 ENCOUNTER — Other Ambulatory Visit: Payer: Self-pay

## 2019-10-03 DIAGNOSIS — Z0000 Encounter for general adult medical examination without abnormal findings: Secondary | ICD-10-CM

## 2019-10-03 DIAGNOSIS — Z131 Encounter for screening for diabetes mellitus: Secondary | ICD-10-CM

## 2019-10-03 LAB — CBC WITH DIFFERENTIAL/PLATELET
Basophils Absolute: 0.1 10*3/uL (ref 0.0–0.1)
Basophils Relative: 0.7 % (ref 0.0–3.0)
Eosinophils Absolute: 0.5 10*3/uL (ref 0.0–0.7)
Eosinophils Relative: 5.1 % — ABNORMAL HIGH (ref 0.0–5.0)
HCT: 46.5 % (ref 39.0–52.0)
Hemoglobin: 15.6 g/dL (ref 13.0–17.0)
Lymphocytes Relative: 31.4 % (ref 12.0–46.0)
Lymphs Abs: 3 10*3/uL (ref 0.7–4.0)
MCHC: 33.5 g/dL (ref 30.0–36.0)
MCV: 89.2 fl (ref 78.0–100.0)
Monocytes Absolute: 0.6 10*3/uL (ref 0.1–1.0)
Monocytes Relative: 6.2 % (ref 3.0–12.0)
Neutro Abs: 5.3 10*3/uL (ref 1.4–7.7)
Neutrophils Relative %: 56.6 % (ref 43.0–77.0)
Platelets: 242 10*3/uL (ref 150.0–400.0)
RBC: 5.21 Mil/uL (ref 4.22–5.81)
RDW: 13.8 % (ref 11.5–15.5)
WBC: 9.4 10*3/uL (ref 4.0–10.5)

## 2019-10-03 LAB — LIPID PANEL
Cholesterol: 215 mg/dL — ABNORMAL HIGH (ref 0–200)
HDL: 28.6 mg/dL — ABNORMAL LOW (ref >39.00)
NonHDL: 186.07
Total CHOL/HDL Ratio: 8
Triglycerides: 280 mg/dL — ABNORMAL HIGH (ref 0.0–149.0)
VLDL: 56 mg/dL — ABNORMAL HIGH (ref 0.0–40.0)

## 2019-10-03 LAB — LDL CHOLESTEROL, DIRECT: Direct LDL: 133 mg/dL

## 2019-10-03 LAB — HEPATIC FUNCTION PANEL
ALT: 26 U/L (ref 0–53)
AST: 21 U/L (ref 0–37)
Albumin: 4.2 g/dL (ref 3.5–5.2)
Alkaline Phosphatase: 78 U/L (ref 39–117)
Bilirubin, Direct: 0.1 mg/dL (ref 0.0–0.3)
Total Bilirubin: 0.6 mg/dL (ref 0.2–1.2)
Total Protein: 6.9 g/dL (ref 6.0–8.3)

## 2019-10-03 LAB — HEMOGLOBIN A1C: Hgb A1c MFr Bld: 5.9 % (ref 4.6–6.5)

## 2019-10-03 LAB — BASIC METABOLIC PANEL
BUN: 13 mg/dL (ref 6–23)
CO2: 30 mEq/L (ref 19–32)
Calcium: 9.4 mg/dL (ref 8.4–10.5)
Chloride: 102 mEq/L (ref 96–112)
Creatinine, Ser: 1.15 mg/dL (ref 0.40–1.50)
GFR: 66.05 mL/min (ref >60.00)
Glucose, Bld: 96 mg/dL (ref 70–99)
Potassium: 4.5 mEq/L (ref 3.5–5.1)
Sodium: 139 mEq/L (ref 135–145)

## 2019-10-04 LAB — REFLEX PSA, FREE
PSA, % Free: 23 % (calc) — ABNORMAL LOW (ref >25)
PSA, Free: 1.3 ng/mL

## 2019-10-04 LAB — PSA, TOTAL WITH REFLEX TO PSA, FREE: PSA, Total: 5.6 ng/mL — ABNORMAL HIGH (ref <=4.0)
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
## 2019-10-09 ENCOUNTER — Ambulatory Visit (INDEPENDENT_AMBULATORY_CARE_PROVIDER_SITE_OTHER): Payer: BC Managed Care – PPO | Admitting: Family Medicine

## 2019-10-09 ENCOUNTER — Other Ambulatory Visit: Payer: Self-pay

## 2019-10-09 ENCOUNTER — Encounter: Payer: Self-pay | Admitting: Family Medicine

## 2019-10-09 VITALS — BP 146/80 | HR 74 | Temp 98.5°F | Ht 67.5 in | Wt 252.0 lb

## 2019-10-09 DIAGNOSIS — Z0000 Encounter for general adult medical examination without abnormal findings: Secondary | ICD-10-CM | POA: Diagnosis not present

## 2019-10-18 DIAGNOSIS — R3912 Poor urinary stream: Secondary | ICD-10-CM | POA: Diagnosis not present

## 2019-10-18 DIAGNOSIS — N401 Enlarged prostate with lower urinary tract symptoms: Secondary | ICD-10-CM | POA: Diagnosis not present

## 2019-10-18 DIAGNOSIS — R972 Elevated prostate specific antigen [PSA]: Secondary | ICD-10-CM | POA: Diagnosis not present

## 2019-10-25 ENCOUNTER — Other Ambulatory Visit: Payer: Self-pay | Admitting: Urology

## 2019-10-25 DIAGNOSIS — R972 Elevated prostate specific antigen [PSA]: Secondary | ICD-10-CM

## 2019-10-31 ENCOUNTER — Other Ambulatory Visit: Payer: Self-pay | Admitting: Family Medicine

## 2019-11-18 ENCOUNTER — Other Ambulatory Visit: Payer: BC Managed Care – PPO

## 2019-12-11 ENCOUNTER — Inpatient Hospital Stay: Admission: RE | Admit: 2019-12-11 | Payer: BC Managed Care – PPO | Source: Ambulatory Visit

## 2019-12-15 ENCOUNTER — Other Ambulatory Visit: Payer: Self-pay | Admitting: Family Medicine

## 2020-01-12 ENCOUNTER — Other Ambulatory Visit: Payer: BC Managed Care – PPO

## 2020-02-02 ENCOUNTER — Other Ambulatory Visit: Payer: BC Managed Care – PPO

## 2020-02-22 ENCOUNTER — Other Ambulatory Visit: Payer: Self-pay | Admitting: Urology

## 2020-02-22 ENCOUNTER — Ambulatory Visit
Admission: RE | Admit: 2020-02-22 | Discharge: 2020-02-22 | Disposition: A | Discharge status: Home / Self Care | Payer: BC Managed Care – PPO | Source: Ambulatory Visit | Attending: Urology | Admitting: Urology

## 2020-02-22 ENCOUNTER — Other Ambulatory Visit: Payer: Self-pay

## 2020-02-22 DIAGNOSIS — R972 Elevated prostate specific antigen [PSA]: Secondary | ICD-10-CM

## 2020-02-22 DIAGNOSIS — S0550XA Penetrating wound with foreign body of unspecified eyeball, initial encounter: Secondary | ICD-10-CM

## 2020-03-20 ENCOUNTER — Ambulatory Visit
Admission: RE | Admit: 2020-03-20 | Discharge: 2020-03-20 | Disposition: A | Discharge status: Home / Self Care | Payer: BC Managed Care – PPO | Source: Ambulatory Visit | Attending: Urology | Admitting: Urology

## 2020-03-20 ENCOUNTER — Ambulatory Visit: Admission: RE | Admit: 2020-03-20 | Payer: BC Managed Care – PPO | Source: Ambulatory Visit

## 2020-03-20 ENCOUNTER — Other Ambulatory Visit: Payer: Self-pay

## 2020-03-20 DIAGNOSIS — Z01818 Encounter for other preprocedural examination: Secondary | ICD-10-CM | POA: Diagnosis not present

## 2020-03-20 DIAGNOSIS — S0550XA Penetrating wound with foreign body of unspecified eyeball, initial encounter: Secondary | ICD-10-CM

## 2020-04-19 DIAGNOSIS — R3912 Poor urinary stream: Secondary | ICD-10-CM | POA: Diagnosis not present

## 2020-04-19 DIAGNOSIS — N401 Enlarged prostate with lower urinary tract symptoms: Secondary | ICD-10-CM | POA: Diagnosis not present

## 2020-04-19 DIAGNOSIS — R972 Elevated prostate specific antigen [PSA]: Secondary | ICD-10-CM | POA: Diagnosis not present

## 2020-04-19 DIAGNOSIS — R3121 Asymptomatic microscopic hematuria: Secondary | ICD-10-CM | POA: Diagnosis not present

## 2020-04-25 DIAGNOSIS — N210 Calculus in bladder: Secondary | ICD-10-CM | POA: Diagnosis not present

## 2020-04-25 DIAGNOSIS — R3121 Asymptomatic microscopic hematuria: Secondary | ICD-10-CM | POA: Diagnosis not present

## 2020-04-30 DIAGNOSIS — N210 Calculus in bladder: Secondary | ICD-10-CM | POA: Diagnosis not present

## 2020-04-30 DIAGNOSIS — R350 Frequency of micturition: Secondary | ICD-10-CM | POA: Diagnosis not present

## 2020-04-30 DIAGNOSIS — N401 Enlarged prostate with lower urinary tract symptoms: Secondary | ICD-10-CM | POA: Diagnosis not present

## 2020-04-30 DIAGNOSIS — R972 Elevated prostate specific antigen [PSA]: Secondary | ICD-10-CM | POA: Diagnosis not present

## 2020-05-07 ENCOUNTER — Other Ambulatory Visit (HOSPITAL_COMMUNITY): Payer: Self-pay | Admitting: Urology

## 2020-05-07 ENCOUNTER — Other Ambulatory Visit: Payer: Self-pay | Admitting: Urology

## 2020-05-07 DIAGNOSIS — R972 Elevated prostate specific antigen [PSA]: Secondary | ICD-10-CM

## 2020-06-21 ENCOUNTER — Encounter (HOSPITAL_BASED_OUTPATIENT_CLINIC_OR_DEPARTMENT_OTHER): Payer: Self-pay | Admitting: Urology

## 2020-06-24 ENCOUNTER — Encounter (HOSPITAL_BASED_OUTPATIENT_CLINIC_OR_DEPARTMENT_OTHER): Payer: Self-pay | Admitting: Urology

## 2020-06-24 ENCOUNTER — Other Ambulatory Visit: Payer: Self-pay

## 2020-06-24 NOTE — Progress Notes (Addendum)
Spoke w/ via phone for pre-op interview---pt Lab needs dos----   I stat 8, ekg           COVID test ------06-29-20 at 1225 pm Arrive at -------530am 07-02-20 No food after midnight clear liquids from midnight until 430 am then npo Medications to take morning of surgery -----nadolol Diabetic medication -----n/a Patient Special Instructions -----fleets enema am of surgery Pre-Op special Istructions -----none Patient verbalized understanding of instructions that were given at this phone interview. Patient denies shortness of breath, chest pain, fever, cough a this phone interview.  Patient instructed by dr easkridge to stop 81 mg aspirin 5 days before surgery

## 2020-06-24 NOTE — Progress Notes (Signed)
NEW Covid Policy July 1115  Surgery Day:  07-02-20  Facility:  [facility]  Type of Surgery: cystoscopy tubp  Fully Covid Vaccinated:  no Not Covid Vaccinated:  covid test 06-29-20 at 1010 am  Do you have symptoms? no  In the past 14 days:        Have you had any symptoms? No       Have you been tested covid positive? no       Have you been in contact with someone covid positive?no        Is pt Immuno-compromised?no

## 2020-06-29 ENCOUNTER — Other Ambulatory Visit (HOSPITAL_COMMUNITY)
Admission: RE | Admit: 2020-06-29 | Discharge: 2020-06-29 | Disposition: A | Discharge status: Home / Self Care | Payer: BC Managed Care – PPO | Source: Ambulatory Visit | Attending: Urology | Admitting: Urology

## 2020-06-29 DIAGNOSIS — Z20822 Contact with and (suspected) exposure to covid-19: Secondary | ICD-10-CM | POA: Insufficient documentation

## 2020-06-29 DIAGNOSIS — Z01812 Encounter for preprocedural laboratory examination: Secondary | ICD-10-CM | POA: Insufficient documentation

## 2020-06-29 LAB — SARS CORONAVIRUS 2 (TAT 6-24 HRS): SARS Coronavirus 2: NEGATIVE

## 2020-07-01 NOTE — Anesthesia Preprocedure Evaluation (Addendum)
Anesthesia Evaluation  Patient identified by MRN, date of birth, ID band Patient awake    Reviewed: Allergy & Precautions, NPO status , Patient's Chart, lab work & pertinent test results  Airway Mallampati: II  TM Distance: >3 FB Neck ROM: Full    Dental no notable dental hx. (+) Teeth Intact, Dental Advisory Given   Pulmonary neg pulmonary ROS,  Chews tobacco   Pulmonary exam normal breath sounds clear to auscultation       Cardiovascular hypertension, Pt. on medications and Pt. on home beta blockers (-) PND Normal cardiovascular exam Rhythm:Regular Rate:Normal     Neuro/Psych negative neurological ROS  negative psych ROS   GI/Hepatic Neg liver ROS, hiatal hernia, GERD  Medicated,  Endo/Other  negative endocrine ROS  Renal/GU Renal disease     Musculoskeletal negative musculoskeletal ROS (+)   Abdominal (+) + obese,   Peds  Hematology   Anesthesia Other Findings   Reproductive/Obstetrics                            Anesthesia Physical Anesthesia Plan  ASA: III  Anesthesia Plan: General   Post-op Pain Management:    Induction: Intravenous  PONV Risk Score and Plan: 2 and Treatment may vary due to age or medical condition, Dexamethasone, Ondansetron and Midazolam  Airway Management Planned: Oral ETT  Additional Equipment: None  Intra-op Plan:   Post-operative Plan: Extubation in OR  Informed Consent: I have reviewed the patients History and Physical, chart, labs and discussed the procedure including the risks, benefits and alternatives for the proposed anesthesia with the patient or authorized representative who has indicated his/her understanding and acceptance.     Dental advisory given  Plan Discussed with: CRNA and Surgeon  Anesthesia Plan Comments:        Anesthesia Quick Evaluation

## 2020-07-02 ENCOUNTER — Encounter (HOSPITAL_BASED_OUTPATIENT_CLINIC_OR_DEPARTMENT_OTHER)
Admission: RE | Disposition: A | Discharge status: Home / Self Care | Payer: Self-pay | Source: Home / Self Care | Attending: Urology

## 2020-07-02 ENCOUNTER — Ambulatory Visit (HOSPITAL_COMMUNITY)
Admission: RE | Admit: 2020-07-02 | Discharge: 2020-07-02 | Disposition: A | Discharge status: Home / Self Care | Payer: BC Managed Care – PPO | Source: Ambulatory Visit | Attending: Urology | Admitting: Urology

## 2020-07-02 ENCOUNTER — Ambulatory Visit (HOSPITAL_BASED_OUTPATIENT_CLINIC_OR_DEPARTMENT_OTHER): Payer: BC Managed Care – PPO | Admitting: Anesthesiology

## 2020-07-02 ENCOUNTER — Encounter (HOSPITAL_BASED_OUTPATIENT_CLINIC_OR_DEPARTMENT_OTHER): Payer: Self-pay | Admitting: Urology

## 2020-07-02 ENCOUNTER — Other Ambulatory Visit: Payer: Self-pay

## 2020-07-02 ENCOUNTER — Ambulatory Visit (HOSPITAL_BASED_OUTPATIENT_CLINIC_OR_DEPARTMENT_OTHER)
Admission: RE | Admit: 2020-07-02 | Discharge: 2020-07-02 | Disposition: A | Discharge status: Home / Self Care | Payer: BC Managed Care – PPO | Attending: Urology | Admitting: Urology

## 2020-07-02 DIAGNOSIS — Z720 Tobacco use: Secondary | ICD-10-CM | POA: Insufficient documentation

## 2020-07-02 DIAGNOSIS — N210 Calculus in bladder: Secondary | ICD-10-CM | POA: Diagnosis not present

## 2020-07-02 DIAGNOSIS — Z79899 Other long term (current) drug therapy: Secondary | ICD-10-CM | POA: Diagnosis not present

## 2020-07-02 DIAGNOSIS — N400 Enlarged prostate without lower urinary tract symptoms: Secondary | ICD-10-CM | POA: Insufficient documentation

## 2020-07-02 DIAGNOSIS — Z7982 Long term (current) use of aspirin: Secondary | ICD-10-CM | POA: Diagnosis not present

## 2020-07-02 DIAGNOSIS — N401 Enlarged prostate with lower urinary tract symptoms: Secondary | ICD-10-CM

## 2020-07-02 DIAGNOSIS — R310 Gross hematuria: Secondary | ICD-10-CM | POA: Insufficient documentation

## 2020-07-02 DIAGNOSIS — I10 Essential (primary) hypertension: Secondary | ICD-10-CM | POA: Diagnosis not present

## 2020-07-02 DIAGNOSIS — R972 Elevated prostate specific antigen [PSA]: Secondary | ICD-10-CM

## 2020-07-02 DIAGNOSIS — K219 Gastro-esophageal reflux disease without esophagitis: Secondary | ICD-10-CM | POA: Diagnosis not present

## 2020-07-02 DIAGNOSIS — N138 Other obstructive and reflux uropathy: Secondary | ICD-10-CM

## 2020-07-02 HISTORY — DX: Personal history of other diseases of the digestive system: Z87.19

## 2020-07-02 HISTORY — DX: Personal history of urinary calculi: Z87.442

## 2020-07-02 HISTORY — DX: Benign prostatic hyperplasia without lower urinary tract symptoms: N40.0

## 2020-07-02 HISTORY — DX: Personal history of colon polyps, unspecified: Z86.0100

## 2020-07-02 HISTORY — PX: PROSTATE BIOPSY: SHX241

## 2020-07-02 HISTORY — DX: Personal history of colonic polyps: Z86.010

## 2020-07-02 HISTORY — DX: Diverticulosis of intestine, part unspecified, without perforation or abscess without bleeding: K57.90

## 2020-07-02 HISTORY — PX: CYSTOSCOPY WITH LITHOLAPAXY: SHX1425

## 2020-07-02 LAB — POCT I-STAT, CHEM 8
BUN: 18 mg/dL (ref 6–20)
Calcium, Ion: 1.27 mmol/L (ref 1.15–1.40)
Chloride: 103 mmol/L (ref 98–111)
Creatinine, Ser: 1.1 mg/dL (ref 0.61–1.24)
Glucose, Bld: 113 mg/dL — ABNORMAL HIGH (ref 70–99)
HCT: 45 % (ref 39.0–52.0)
Hemoglobin: 15.3 g/dL (ref 13.0–17.0)
Potassium: 3.9 mmol/L (ref 3.5–5.1)
Sodium: 144 mmol/L (ref 135–145)
TCO2: 27 mmol/L (ref 22–32)

## 2020-07-02 SURGERY — CYSTOSCOPY, WITH BLADDER CALCULUS LITHOLAPAXY
Anesthesia: General | Site: Prostate

## 2020-07-02 MED ORDER — HYDROMORPHONE HCL 1 MG/ML IJ SOLN
INTRAMUSCULAR | Status: AC
  Filled 2020-07-02: qty 1

## 2020-07-02 MED ORDER — PHENYLEPHRINE 40 MCG/ML (10ML) SYRINGE FOR IV PUSH (FOR BLOOD PRESSURE SUPPORT)
PREFILLED_SYRINGE | INTRAVENOUS | Status: DC | PRN
  Administered 2020-07-02 (×2): 80 ug via INTRAVENOUS

## 2020-07-02 MED ORDER — ONDANSETRON HCL 4 MG/2ML IJ SOLN
INTRAMUSCULAR | Status: AC
  Filled 2020-07-02: qty 2

## 2020-07-02 MED ORDER — CEFAZOLIN SODIUM-DEXTROSE 2-4 GM/100ML-% IV SOLN: 2.0000 g | Freq: Once | INTRAVENOUS | Status: DC

## 2020-07-02 MED ORDER — LIDOCAINE 2% (20 MG/ML) 5 ML SYRINGE
INTRAMUSCULAR | Status: AC
  Filled 2020-07-02: qty 5

## 2020-07-02 MED ORDER — EPHEDRINE SULFATE-NACL 50-0.9 MG/10ML-% IV SOSY
PREFILLED_SYRINGE | INTRAVENOUS | Status: DC | PRN
  Administered 2020-07-02: 10 mg via INTRAVENOUS

## 2020-07-02 MED ORDER — LIDOCAINE 2% (20 MG/ML) 5 ML SYRINGE
INTRAMUSCULAR | Status: DC | PRN
  Administered 2020-07-02: 60 mg via INTRAVENOUS

## 2020-07-02 MED ORDER — MIDAZOLAM HCL 5 MG/5ML IJ SOLN
INTRAMUSCULAR | Status: DC | PRN
  Administered 2020-07-02: 2 mg via INTRAVENOUS

## 2020-07-02 MED ORDER — SODIUM CHLORIDE 0.9 % IR SOLN
Status: DC | PRN
  Administered 2020-07-02 (×2): 6000 mL via INTRAVESICAL

## 2020-07-02 MED ORDER — FENTANYL CITRATE (PF) 100 MCG/2ML IJ SOLN
INTRAMUSCULAR | Status: DC | PRN
  Administered 2020-07-02 (×2): 50 ug via INTRAVENOUS

## 2020-07-02 MED ORDER — SULFAMETHOXAZOLE-TRIMETHOPRIM 800-160 MG PO TABS
1.0000 | ORAL_TABLET | Freq: Two times a day (BID) | ORAL | 0 refills | Status: DC
Start: 2020-07-02 — End: 2021-01-07

## 2020-07-02 MED ORDER — PROMETHAZINE HCL 25 MG/ML IJ SOLN: 6.2500 mg | INTRAMUSCULAR | Status: DC | PRN

## 2020-07-02 MED ORDER — FINASTERIDE 5 MG PO TABS
5.0000 mg | ORAL_TABLET | Freq: Every day | ORAL | 11 refills | Status: AC
Start: 2020-07-02 — End: ?

## 2020-07-02 MED ORDER — FENTANYL CITRATE (PF) 100 MCG/2ML IJ SOLN
INTRAMUSCULAR | Status: AC
  Filled 2020-07-02: qty 2

## 2020-07-02 MED ORDER — FLEET ENEMA 7-19 GM/118ML RE ENEM: 1.0000 | ENEMA | Freq: Once | RECTAL | Status: DC

## 2020-07-02 MED ORDER — MEPERIDINE HCL 25 MG/ML IJ SOLN: 6.2500 mg | INTRAMUSCULAR | Status: DC | PRN

## 2020-07-02 MED ORDER — PROPOFOL 10 MG/ML IV BOLUS
INTRAVENOUS | Status: DC | PRN
  Administered 2020-07-02: 150 mg via INTRAVENOUS

## 2020-07-02 MED ORDER — ONDANSETRON HCL 4 MG/2ML IJ SOLN
INTRAMUSCULAR | Status: DC | PRN
  Administered 2020-07-02: 4 mg via INTRAVENOUS

## 2020-07-02 MED ORDER — HYDROCODONE-ACETAMINOPHEN 7.5-325 MG PO TABS: 1.0000 | ORAL_TABLET | Freq: Once | ORAL | Status: DC | PRN

## 2020-07-02 MED ORDER — HYDROMORPHONE HCL 1 MG/ML IJ SOLN
0.2500 mg | INTRAMUSCULAR | Status: DC | PRN
  Administered 2020-07-02 (×2): 0.5 mg via INTRAVENOUS

## 2020-07-02 MED ORDER — DEXAMETHASONE SODIUM PHOSPHATE 10 MG/ML IJ SOLN
INTRAMUSCULAR | Status: AC
  Filled 2020-07-02: qty 1

## 2020-07-02 MED ORDER — MIDAZOLAM HCL 2 MG/2ML IJ SOLN
INTRAMUSCULAR | Status: AC
  Filled 2020-07-02: qty 2

## 2020-07-02 MED ORDER — LACTATED RINGERS IV SOLN: INTRAVENOUS | Status: DC

## 2020-07-02 MED ORDER — ACETAMINOPHEN 10 MG/ML IV SOLN: 1000.0000 mg | Freq: Once | INTRAVENOUS | Status: DC | PRN

## 2020-07-02 MED ORDER — CIPROFLOXACIN IN D5W 400 MG/200ML IV SOLN
INTRAVENOUS | Status: AC
  Filled 2020-07-02: qty 200

## 2020-07-02 MED ORDER — DEXAMETHASONE SODIUM PHOSPHATE 4 MG/ML IJ SOLN
INTRAMUSCULAR | Status: DC | PRN
  Administered 2020-07-02: 5 mg via INTRAVENOUS

## 2020-07-02 MED ORDER — CIPROFLOXACIN IN D5W 400 MG/200ML IV SOLN
INTRAVENOUS | Status: DC | PRN
Start: 2020-07-02 — End: 2020-07-02
  Administered 2020-07-02: 400 mg via INTRAVENOUS

## 2020-07-02 SURGICAL SUPPLY — 39 items
BAG DRAIN URO-CYSTO SKYTR STRL (DRAIN) ×4 IMPLANT
BAG DRN RND TRDRP ANRFLXCHMBR (UROLOGICAL SUPPLIES) ×3
BAG DRN UROCATH (DRAIN) ×3
BAG URINE DRAIN 2000ML AR STRL (UROLOGICAL SUPPLIES) ×4 IMPLANT
CATH COUDE FOLEY 2W 5CC 20FR (CATHETERS) ×4 IMPLANT
CATH URET 5FR 28IN CONE TIP (BALLOONS)
CATH URET 5FR 28IN OPEN ENDED (CATHETERS) IMPLANT
CATH URET 5FR 70CM CONE TIP (BALLOONS) IMPLANT
CATH URET DUAL LUMEN 6-10FR 50 (CATHETERS) IMPLANT
CLOTH BEACON ORANGE TIMEOUT ST (SAFETY) ×4 IMPLANT
DRSG TELFA 3X8 NADH (GAUZE/BANDAGES/DRESSINGS) IMPLANT
FIBER LASER FLEXIVA 1000 (UROLOGICAL SUPPLIES) ×4 IMPLANT
GLOVE BIO SURGEON STRL SZ7.5 (GLOVE) ×4 IMPLANT
GLOVE BIO SURGEON STRL SZ8 (GLOVE) IMPLANT
GOWN STRL REUS W/TWL LRG LVL3 (GOWN DISPOSABLE) ×4 IMPLANT
GUIDEWIRE ANG ZIPWIRE 038X150 (WIRE) IMPLANT
GUIDEWIRE STR DUAL SENSOR (WIRE) ×4 IMPLANT
GUIDEWIRE ZIPWRE .038 STRAIGHT (WIRE) IMPLANT
HOLDER FOLEY CATH W/STRAP (MISCELLANEOUS) ×4 IMPLANT
INST BIOPSY MAXCORE 18GX25 (NEEDLE) ×4 IMPLANT
INSTR BIOPSY MAXCORE 18GX20 (NEEDLE) IMPLANT
IV NS IRRIG 3000ML ARTHROMATIC (IV SOLUTION) ×16 IMPLANT
KIT TURNOVER CYSTO (KITS) ×4 IMPLANT
MANIFOLD NEPTUNE II (INSTRUMENTS) ×4 IMPLANT
NDL SAFETY ECLIPSE 18X1.5 (NEEDLE) IMPLANT
NEEDLE HYPO 18GX1.5 SHARP (NEEDLE)
NEEDLE SPNL 22GX7 QUINCKE BK (NEEDLE) IMPLANT
NS IRRIG 1000ML POUR BTL (IV SOLUTION) ×8 IMPLANT
NS IRRIG 500ML POUR BTL (IV SOLUTION) IMPLANT
PACK CYSTO (CUSTOM PROCEDURE TRAY) ×4 IMPLANT
SURGILUBE 2OZ TUBE FLIPTOP (MISCELLANEOUS) IMPLANT
SYR CONTROL 10ML LL (SYRINGE) IMPLANT
SYR TOOMEY IRRIG 70ML (MISCELLANEOUS) ×4
SYRINGE TOOMEY IRRIG 70ML (MISCELLANEOUS) ×3 IMPLANT
TOWEL OR 17X26 10 PK STRL BLUE (TOWEL DISPOSABLE) IMPLANT
TUBE CONNECTING 12X1/4 (SUCTIONS) ×4 IMPLANT
TUBING UROLOGY SET (TUBING) ×4 IMPLANT
UNDERPAD 30X36 HEAVY ABSORB (UNDERPADS AND DIAPERS) IMPLANT
WATER STERILE IRR 500ML POUR (IV SOLUTION) ×4 IMPLANT

## 2020-07-02 NOTE — Transfer of Care (Signed)
Immediate Anesthesia Transfer of Care Note  Patient: Edgar Saunders  Procedure(s) Performed: CYSTOSCOPY WITH LITHOLAPAXY/ HOLMIUM LASER LITHOTRIPSY (N/A Bladder) BIOPSY TRANSRECTAL ULTRASONIC PROSTATE (TUBP) (N/A Prostate)  Patient Location: PACU  Anesthesia Type:General  Level of Consciousness: drowsy  Airway & Oxygen Therapy: Patient Spontanous Breathing and Patient connected to face mask oxygen  Post-op Assessment: Report given to RN and Post -op Vital signs reviewed and stable  Post vital signs: Reviewed and stable  Last Vitals:  Vitals Value Taken Time  BP    Temp    Pulse 68 07/02/20 0908  Resp 14 07/02/20 0908  SpO2 95 % 07/02/20 0908  Vitals shown include unvalidated device data.  Last Pain:  Vitals:   07/02/20 0555  TempSrc: Oral  PainSc: 0-No pain      Patients Stated Pain Goal: 5 (72/09/10 6816)  Complications: No complications documented.

## 2020-07-02 NOTE — Anesthesia Postprocedure Evaluation (Signed)
Anesthesia Post Note  Patient: Edgar Saunders  Procedure(s) Performed: CYSTOSCOPY WITH LITHOLAPAXY/ HOLMIUM LASER LITHOTRIPSY (N/A Bladder) BIOPSY TRANSRECTAL ULTRASONIC PROSTATE (TUBP) (N/A Prostate)     Patient location during evaluation: PACU Anesthesia Type: General Level of consciousness: awake and alert Pain management: pain level controlled Vital Signs Assessment: post-procedure vital signs reviewed and stable Respiratory status: spontaneous breathing, nonlabored ventilation, respiratory function stable and patient connected to nasal cannula oxygen Cardiovascular status: blood pressure returned to baseline and stable Postop Assessment: no apparent nausea or vomiting Anesthetic complications: no   No complications documented.  Last Vitals:  Vitals:   07/02/20 1000 07/02/20 1030  BP: (!) 154/79 (!) 153/96  Pulse: 69 60  Resp: 16 20  Temp:  36.7 C  SpO2: 95% 96%    Last Pain:  Vitals:   07/02/20 1030  TempSrc:   PainSc: 0-No pain                 Barnet Glasgow

## 2020-07-02 NOTE — Anesthesia Procedure Notes (Signed)
Procedure Name: LMA Insertion Date/Time: 07/02/2020 7:43 AM Performed by: Lieutenant Diego, CRNA Pre-anesthesia Checklist: Patient identified, Emergency Drugs available, Suction available and Patient being monitored Patient Re-evaluated:Patient Re-evaluated prior to induction Oxygen Delivery Method: Circle system utilized Preoxygenation: Pre-oxygenation with 100% oxygen Induction Type: IV induction Ventilation: Mask ventilation without difficulty LMA: LMA with gastric port inserted LMA Size: 4.0 Number of attempts: 1 Placement Confirmation: positive ETCO2 and breath sounds checked- equal and bilateral Tube secured with: Tape Dental Injury: Teeth and Oropharynx as per pre-operative assessment

## 2020-07-02 NOTE — Op Note (Signed)
Preoperative diagnosis: Gross hematuria, BPH, bladder stone, elevated PSA Postoperative diagnosis: Same  Procedure: Cystolitholopaxy 2 cm bladder stone, transrectal ultrasound of prostate, prostate biopsy transrectal  Surgeon: Junious Silk  Anesthesia: General  Indication for procedure: [name] is a 54 year old male with a history of gross hematuria.  CT scan revealed 200 g prostate and bladder stone.  He also had history of elevated PSA that was rising.  Prior MRI benign with PI-RADS 3 lesion.  Findings: On exam under anesthesia the penis was circumcised without mass or lesion.  Testicles descended bilaterally and palpably normal.  Scrotum normal.  Large hemorrhoids.  On digital rectal exam the prostate was 80 g but smooth without hard area nodule.  On cystoscopy large prostate with lateral lobe and median lobe hypertrophy.  Elongated prostatic urethra of 7 cm.  Difficult to get into the bladder.  Bladder stone located and had excellent fragmentation.  He would be a good holmium laser enucleation of prostate candidate.  Also a staged TURP of median lobe would be feasible.  Finasteride started postop today.  Transrectal ultrasound of the prostate revealed normal seminal vesicles and prostate.  Prostate capsule intact.  Elongated prostate length at 7 cm. Length 6.88 cm Width 6.75 cm Height 7.81 cm Volume 189.91 mL  Description of procedure: After consent was obtained the patient was brought to the operating room.  After adequate anesthesia was placed in lithotomy position and prepped and draped in the usual sterile fashion.  Timeout was performed to confirm the patient and procedure.  The cystoscope was passed per urethra and the bladder inspected with 30 degree and 70 degree lens.  It was still a bit difficult to see around the base of the prostate to the trigone and around the bladder neck.  Stone was located in the bladder.  I then passed a 1000 m laser fiber and began to fragment the stone.   For ease of irrigation the cystoscope was removed and the continuous flow laser sheath passed.  The stone was dusted and the fragments drained.  I could not locate any other fragments.  There was some oozing from the prostatic mucosa with many friable vessels from accessing the bladder.  The scope was withdrawn and a 20 [place] coud catheter passed without difficulty.  18 cc were placed in the balloon and it was seated at the bladder neck.  Irrigation and some light traction provided excellent hemostasis.  I then turned my attention to the prostate biopsy.  He had large external hemorrhoids but no palpable internal hemorrhoids.  DRE was performed.  Gloves changed and ultrasound probe inserted transrectally.  Prostate ultrasound was performed and measurements.  Then a standard 12 core biopsy was taken.  There was no bleeding on final DRE.   Glove change and catheter irrigation noted the urine to be light pink.  He was then awakened and taken to the recovery room in stable condition.  Complications: None  Blood loss: Minimal  Specimens to pathology: #1 right base lateral #2 right base medial #3 right mid lateral #4 right mid medial #5 right apex lateral #6 right apex medial #7 left base lateral #8 left base medial #9 left mid lateral #10 left mid medial #11 left apex lateral #12 left apex medial  Drains: 20 French coud catheter  Disposition: Patient stable to PACU

## 2020-07-02 NOTE — Discharge Instructions (Signed)
Indwelling Urinary Catheter Care, Adult An indwelling urinary catheter is a thin tube that is put into your bladder. The tube helps to drain pee (urine) out of your body. The tube goes in through your urethra. Your urethra is where pee comes out of your body. Your pee will come out through the catheter, then it will go into a bag (drainage bag). Take good care of your catheter so it will work well.  Remove the catheter in the morning, July 03, 2020 as instructed or call the office for assistance  How to wear your catheter and bag Supplies needed  Sticky tape (adhesive tape) or a leg strap.  Alcohol wipe or soap and water (if you use tape).  A clean towel (if you use tape).  Large overnight bag.  Smaller bag (leg bag). Wearing your catheter Attach your catheter to your leg with tape or a leg strap.  Make sure the catheter is not pulled tight.  If a leg strap gets wet, take it off and put on a dry strap.  If you use tape to hold the bag on your leg: 1. Use an alcohol wipe or soap and water to wash your skin where the tape made it sticky before. 2. Use a clean towel to pat-dry that skin. 3. Use new tape to make the bag stay on your leg. Wearing your bags You should have been given a large overnight bag.  You may wear the overnight bag in the day or night.  Always have the overnight bag lower than your bladder.  Do not let the bag touch the floor.  Before you go to sleep, put a clean plastic bag in a wastebasket. Then hang the overnight bag inside the wastebasket. You should also have a smaller leg bag that fits under your clothes.  Always wear the leg bag below your knee.  Do not wear your leg bag at night. How to care for your skin and catheter Supplies needed  A clean washcloth.  Water and mild soap.  A clean towel. Caring for your skin and catheter      Clean the skin around your catheter every day: 1. Wash your hands with soap and water. 2. Wet a clean  washcloth in warm water and mild soap. 3. Clean the skin around your urethra.  If you are male:  Gently spread the folds of skin around your vagina (labia).  With the washcloth in your other hand, wipe the inner side of your labia on each side. Wipe from front to back.  If you are male:  Pull back any skin that covers the end of your penis (foreskin).  With the washcloth in your other hand, wipe your penis in small circles. Start wiping at the tip of your penis, then move away from the catheter.  Move the foreskin back in place, if needed. 4. With your free hand, hold the catheter close to where it goes into your body.  Keep holding the catheter during cleaning so it does not get pulled out. 5. With the washcloth in your other hand, clean the catheter.  Only wipe downward on the catheter.  Do not wipe upward toward your body. Doing this may push germs into your urethra and cause infection. 6. Use a clean towel to pat-dry the catheter and the skin around it. Make sure to wipe off all soap. 7. Wash your hands with soap and water.  Shower every day. Do not take baths.  Do not use  cream, ointment, or lotion on the area where the catheter goes into your body, unless your doctor tells you to.  Do not use powders, sprays, or lotions on your genital area.  Check your skin around the catheter every day for signs of infection. Check for: ? Redness, swelling, or pain. ? Fluid or blood. ? Warmth. ? Pus or a bad smell. How to empty the bag Supplies needed  Rubbing alcohol.  Gauze pad or cotton ball.  Tape or a leg strap. Emptying the bag Pour the pee out of your bag when it is ?- full, or at least 2-3 times a day. Do this for your overnight bag and your leg bag. 1. Wash your hands with soap and water. 2. Separate (detach) the bag from your leg. 3. Hold the bag over the toilet or a clean pail. Keep the bag lower than your hips and bladder. This is so the pee (urine) does not go  back into the tube. 4. Open the pour spout. It is at the bottom of the bag. 5. Empty the pee into the toilet or pail. Do not let the pour spout touch any surface. 6. Put rubbing alcohol on a gauze pad or cotton ball. 7. Use the gauze pad or cotton ball to clean the pour spout. 8. Close the pour spout. 9. Attach the bag to your leg with tape or a leg strap. 10. Wash your hands with soap and water. Follow instructions for cleaning the drainage bag:  From the product maker.  As told by your doctor. How to change the bag Supplies needed  Alcohol wipes.  A clean bag.  Tape or a leg strap. Changing the bag Replace your bag when it starts to leak, smell bad, or look dirty. 1. Wash your hands with soap and water. 2. Separate the dirty bag from your leg. 3. Pinch the catheter with your fingers so that pee does not spill out. 4. Separate the catheter tube from the bag tube where these tubes connect (at the connection valve). Do not let the tubes touch any surface. 5. Clean the end of the catheter tube with an alcohol wipe. Use a different alcohol wipe to clean the end of the bag tube. 6. Connect the catheter tube to the tube of the clean bag. 7. Attach the clean bag to your leg with tape or a leg strap. Do not make the bag tight on your leg. 8. Wash your hands with soap and water. General rules   Never pull on your catheter. Never try to take it out. Doing that can hurt you.  Always wash your hands before and after you touch your catheter or bag. Use a mild, fragrance-free soap. If you do not have soap and water, use hand sanitizer.  Always make sure there are no twists or bends (kinks) in the catheter tube.  Always make sure there are no leaks in the catheter or bag.  Drink enough fluid to keep your pee pale yellow.  Do not take baths, swim, or use a hot tub.  If you are male, wipe from front to back after you poop (have a bowel movement). Contact a doctor if:  Your pee is  cloudy.  Your pee smells worse than usual.  Your catheter gets clogged.  Your catheter leaks.  Your bladder feels full. Get help right away if:  You have redness, swelling, or pain where the catheter goes into your body.  You have fluid, blood, pus, or a  bad smell coming from the area where the catheter goes into your body.  Your skin feels warm where the catheter goes into your body.  You have a fever.  You have pain in your: ? Belly (abdomen). ? Legs. ? Lower back. ? Bladder.  You see blood in the catheter.  Your pee is pink or red.  You feel sick to your stomach (nauseous).  You throw up (vomit).  You have chills.  Your pee is not draining into the bag.  Your catheter gets pulled out. Summary  An indwelling urinary catheter is a thin tube that is placed into the bladder to help drain pee (urine) out of the body.  The catheter is placed into the part of the body that drains pee from the bladder (urethra).  Taking good care of your catheter will keep it working properly and help prevent problems.  Always wash your hands before and after touching your catheter or bag.  Never pull on your catheter or try to take it out. This information is not intended to replace advice given to you by your health care provider. Make sure you discuss any questions you have with your health care provider. Document Revised: 03/24/2019 Document Reviewed: 07/16/2017 Elsevier Patient Education  [place]. Cystoscopy Cystoscopy is a procedure that is used to help diagnose and sometimes treat conditions that affect the lower urinary tract. The lower urinary tract includes the bladder and the urethra. The urethra is the tube that drains urine from the bladder. Cystoscopy is done using a thin, tube-shaped instrument with a light and camera at the end (cystoscope). The cystoscope may be hard or flexible, depending on the goal of the procedure. The cystoscope is inserted through the  urethra, into the bladder. Cystoscopy may be recommended if you have:  Urinary tract infections that keep coming back.  Blood in the urine (hematuria).  An inability to control when you urinate (urinary incontinence) or an overactive bladder.  Unusual cells found in a urine sample.  A blockage in the urethra, such as a urinary stone.  Painful urination.  An abnormality in the bladder found during an intravenous pyelogram (IVP) or CT scan. Cystoscopy may also be done to remove a sample of tissue to be examined under a microscope (biopsy). Tell a health care provider about:  Any allergies you have.  All medicines you are taking, including vitamins, herbs, eye drops, creams, and over-the-counter medicines.  Any problems you or family members have had with anesthetic medicines.  Any blood disorders you have.  Any surgeries you have had.  Any medical conditions you have.  Whether you are pregnant or may be pregnant. What are the risks? Generally, this is a safe procedure. However, problems may occur, including:  Infection.  Bleeding.  Allergic reactions to medicines.  Damage to other structures or organs. What happens before the procedure?  Ask your health care provider about: ? Changing or stopping your regular medicines. This is especially important if you are taking diabetes medicines or blood thinners. ? Taking medicines such as aspirin and ibuprofen. These medicines can thin your blood. Do not take these medicines unless your health care provider tells you to take them. ? Taking over-the-counter medicines, vitamins, herbs, and supplements.  Follow instructions from your health care provider about eating or drinking restrictions.  Ask your health care provider what steps will be taken to help prevent infection. These may include: ? Washing skin with a germ-killing soap. ? Taking antibiotic medicine.  You may have an exam or testing, such as: ? X-rays of the  bladder, urethra, or kidneys. ? Urine tests to check for signs of infection.  Plan to have someone take you home from the hospital or clinic. What happens during the procedure?   You will be given one or more of the following: ? A medicine to help you relax (sedative). ? A medicine to numb the area (local anesthetic).  The area around the opening of your urethra will be cleaned.  The cystoscope will be passed through your urethra into your bladder.  Germ-free (sterile) fluid will flow through the cystoscope to fill your bladder. The fluid will stretch your bladder so that your health care provider can clearly examine your bladder walls.  Your doctor will look at the urethra and bladder. Your doctor may take a biopsy or remove stones.  The cystoscope will be removed, and your bladder will be emptied. The procedure may vary among health care providers and hospitals. What can I expect after the procedure? After the procedure, it is common to have:  Some soreness or pain in your abdomen and urethra.  Urinary symptoms. These include: ? Mild pain or burning when you urinate. Pain should stop within a few minutes after you urinate. This may last for up to 1 week. ? A small amount of blood in your urine for several days. ? Feeling like you need to urinate but producing only a small amount of urine. Follow these instructions at home: Medicines  Take over-the-counter and prescription medicines only as told by your health care provider.  If you were prescribed an antibiotic medicine, take it as told by your health care provider. Do not stop taking the antibiotic even if you start to feel better. General instructions  Return to your normal activities as told by your health care provider. Ask your health care provider what activities are safe for you.  Do not drive for 24 hours if you were given a sedative during your procedure.  Watch for any blood in your urine. If the amount of blood  in your urine increases, call your health care provider.  Follow instructions from your health care provider about eating or drinking restrictions.  If a tissue sample was removed for testing (biopsy) during your procedure, it is up to you to get your test results. Ask your health care provider, or the department that is doing the test, when your results will be ready.  Drink enough fluid to keep your urine pale yellow.  Keep all follow-up visits as told by your health care provider. This is important. Contact a health care provider if you:  Have pain that gets worse or does not get better with medicine, especially pain when you urinate.  Have trouble urinating.  Have more blood in your urine. Get help right away if you:  Have blood clots in your urine.  Have abdominal pain.  Have a fever or chills.  Are unable to urinate. Summary  Cystoscopy is a procedure that is used to help diagnose and sometimes treat conditions that affect the lower urinary tract.  Cystoscopy is done using a thin, tube-shaped instrument with a light and camera at the end.  After the procedure, it is common to have some soreness or pain in your abdomen and urethra.  Watch for any blood in your urine. If the amount of blood in your urine increases, call your health care provider.  If you were prescribed an antibiotic medicine, take  it as told by your health care provider. Do not stop taking the antibiotic even if you start to feel better. This information is not intended to replace advice given to you by your health care provider. Make sure you discuss any questions you have with your health care provider. Document Revised: 11/22/2018 Document Reviewed: 11/22/2018 Elsevier Patient Education  [place].  Transrectal Ultrasound-Guided Prostate Biopsy, Care After This sheet gives you information about how to care for yourself after your procedure. Your doctor may also give you more specific  instructions. If you have problems or questions, contact your doctor. What can I expect after the procedure? After the procedure, it is common to have:  Pain and discomfort in your butt, especially while sitting.  Pink-colored pee (urine), due to small amounts of blood in the pee.  Burning while peeing (urinating).  Blood in your poop (stool).  Bleeding from your butt.  Blood in your semen. Follow these instructions at home: Medicines  Take over-the-counter and prescription medicines only as told by your doctor.  If you were prescribed antibiotic medicine, take it as told by your doctor. Do not stop taking the antibiotic even if you start to feel better. Activity   Do not drive for 24 hours if you were given a medicine to help you relax (sedative) during your procedure.  Return to your normal activities as told by your doctor. Ask your doctor what activities are safe for you.  Ask your doctor when it is okay for you to have sex.  Do not lift anything that is heavier than 10 lb (4.5 kg), or the limit that you are told, until your doctor says that it is safe. General instructions   Drink enough water to keep your pee pale yellow.  Watch your pee, poop, and semen for new bleeding or bleeding that gets worse.  Keep all follow-up visits as told by your doctor. This is important. Contact a doctor if you:  Have blood clots in your pee or poop.  Notice that your pee smells bad or unusual.  Have very bad belly pain.  Have trouble peeing.  Notice that your lower belly feels firm.  Have blood in your pee for more than 2 weeks after the procedure.  Have blood in your semen for more than 2 months after the procedure.  Have problems getting an erection.  Feel sick to your stomach (nauseous).  Throw up (vomit).  Have new or worse bleeding in your pee, poop, or semen. Get help right away if you:  Have a fever or chills.  Have bright red pee.  Have very bad pain  that does not get better with medicine.  Cannot pee. Summary  After this procedure, it is common to have pain and discomfort around your butt, especially while sitting.  You may have blood in your pee and poop.  It is common to have blood in your semen for 1-2 months.  If you were prescribed antibiotic medicine, take it as told by your doctor. Do not stop taking the antibiotic even if you start to feel better.  Get help right away if you have a fever or chills. This information is not intended to replace advice given to you by your health care provider. Make sure you discuss any questions you have with your health care provider. Document Revised: 03/22/2019 Document Reviewed: 09/28/2017 Elsevier Patient Education  [place] Instructions  Activity: Get plenty of rest  for the remainder of the day. A responsible individual must stay with you for 24 hours following the procedure.  For the next 24 hours, DO NOT: -Drive a car -Paediatric nurse -Drink alcoholic beverages -Take any medication unless instructed by your physician -Make any legal decisions or sign important papers.  Meals: Start with liquid foods such as gelatin or soup. Progress to regular foods as tolerated. Avoid greasy, spicy, heavy foods. If nausea and/or vomiting occur, drink only clear liquids until the nausea and/or vomiting subsides. Call your physician if vomiting continues.  Special Instructions/Symptoms: Your throat may feel dry or sore from the anesthesia or the breathing tube placed in your throat during surgery. If this causes discomfort, gargle with warm salt water. The discomfort should disappear within 24 hours.
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
## 2020-07-03 ENCOUNTER — Encounter (HOSPITAL_BASED_OUTPATIENT_CLINIC_OR_DEPARTMENT_OTHER): Payer: Self-pay | Admitting: Urology

## 2020-07-03 LAB — SURGICAL PATHOLOGY

## 2020-07-24 DIAGNOSIS — R972 Elevated prostate specific antigen [PSA]: Secondary | ICD-10-CM | POA: Diagnosis not present

## 2020-07-24 DIAGNOSIS — N401 Enlarged prostate with lower urinary tract symptoms: Secondary | ICD-10-CM | POA: Diagnosis not present

## 2020-07-24 DIAGNOSIS — R3912 Poor urinary stream: Secondary | ICD-10-CM | POA: Diagnosis not present

## 2020-11-27 ENCOUNTER — Telehealth: Payer: Self-pay | Admitting: Family Medicine

## 2020-11-27 NOTE — Telephone Encounter (Signed)
Spoke with pt schedule CPE and labs

## 2020-11-27 NOTE — Telephone Encounter (Signed)
Pharmacy requests refill on: Nadolol 20 mg  LAST REFILL: 12/18/2019 (Q-90, R-3) LAST OV: 10/09/2019 NEXT OV: Not Scheduled PHARMACY: [facility] [place], [place]

## 2020-12-17 ENCOUNTER — Other Ambulatory Visit: Payer: BC Managed Care – PPO

## 2020-12-26 ENCOUNTER — Encounter: Payer: BC Managed Care – PPO | Admitting: Family Medicine

## 2021-01-01 DIAGNOSIS — U071 COVID-19: Secondary | ICD-10-CM | POA: Diagnosis not present

## 2021-01-01 DIAGNOSIS — Z20822 Contact with and (suspected) exposure to covid-19: Secondary | ICD-10-CM | POA: Diagnosis not present

## 2021-01-07 ENCOUNTER — Ambulatory Visit: Payer: BC Managed Care – PPO | Admitting: Internal Medicine

## 2021-01-07 ENCOUNTER — Encounter: Payer: Self-pay | Admitting: Internal Medicine

## 2021-01-07 VITALS — BP 166/98 | HR 97 | Temp 97.8°F | Wt 250.0 lb

## 2021-01-07 DIAGNOSIS — U071 COVID-19: Secondary | ICD-10-CM

## 2021-01-07 DIAGNOSIS — R059 Cough, unspecified: Secondary | ICD-10-CM | POA: Diagnosis not present

## 2021-01-07 DIAGNOSIS — R0981 Nasal congestion: Secondary | ICD-10-CM | POA: Diagnosis not present

## 2021-01-07 DIAGNOSIS — I10 Essential (primary) hypertension: Secondary | ICD-10-CM

## 2021-01-07 MED ORDER — LOSARTAN POTASSIUM 50 MG PO TABS
25.0000 mg | ORAL_TABLET | Freq: Every day | ORAL | 0 refills | Status: DC
Start: 2021-01-07 — End: 2021-01-09

## 2021-01-07 NOTE — Patient Instructions (Signed)

## 2021-01-07 NOTE — Progress Notes (Signed)
Subjective:    [name], male    DOB: Jan 22, 1966, 55 y.o.   MRN: [id number]  HPI  Patient presents the clinic today with complaint of a nasal congestion and cough.  He reports this started 2 weeks ago.  He is blowing blood-tinged yellow mucus out of his nose.  The cough is mostly nonproductive.  He denies headache, runny nose, ear pain, sore throat, loss of taste/smell, shortness of breath or chest pain.  He reports he was diagnosed with Covid 8 days ago.  He reports elevated blood pressures since being diagnosed with Covid with associated lightheadedness.  He reports his blood pressure has been as high as 176/104.  He is taking nadolol as prescribed.  He is unsure what his blood pressure typically runs at home.  He has tried Nasal saline OTC with some relief of symptoms.  Review of Systems      Past Medical History:  Diagnosis Date  . BPH (benign prostatic hyperplasia)   . Diverticulosis   . GERD (gastroesophageal reflux disease)   . History of colon polyps   . History of hiatal hernia   . History of kidney stones   . Hypertension     Current Outpatient Medications  Medication Sig Dispense Refill  . alfuzosin (UROXATRAL) 10 MG 24 hr tablet Take 10 mg by mouth every evening.     Marland Kitchen aspirin 81 MG tablet Take 81 mg by mouth daily.    Marland Kitchen esomeprazole (NEXIUM) 40 MG capsule TAKE 1 CAPSULE BY MOUTH EVERY DAY AS NEEDED 90 capsule 3  . finasteride (PROSCAR) 5 MG tablet Take 1 tablet (5 mg total) by mouth daily. 30 tablet 11  . Multiple Vitamin (MULTIVITAMIN) tablet Take 1 tablet by mouth daily.    . nadolol (CORGARD) 20 MG tablet TAKE 1 TABLET BY MOUTH EVERY DAY 90 tablet 0  . sulfamethoxazole-trimethoprim (BACTRIM DS) 800-160 MG tablet Take 1 tablet by mouth 2 (two) times daily. 6 tablet 0   No current facility-administered medications for this visit.    Allergies  Allergen Reactions  . Cefdinir Nausea Only  . Ivp Dye [Iodinated Diagnostic Agents] Rash    Family  History  Problem Relation Age of Onset  . Diabetes Mother   . Colon cancer Paternal Grandmother     Social History   Socioeconomic History  . Marital status: Married    Spouse name: Not on file  . Number of children: Not on file  . Years of education: Not on file  . Highest education level: Not on file  Occupational History  . Not on file  Tobacco Use  . Smoking status: Never Smoker  . Smokeless tobacco: Current User    Types: Chew  Vaping Use  . Vaping Use: Never used  Substance and Sexual Activity  . Alcohol use: Yes    Alcohol/week: 0.0 standard drinks    Comment: once every two weeks  . Drug use: No  . Sexual activity: Not on file  Other Topics Concern  . Not on file  Social History Narrative   No regular exercise   5 people living at residence   Social Determinants of Health   Financial Resource Strain: Not on file  Food Insecurity: Not on file  Transportation Needs: Not on file  Physical Activity: Not on file  Stress: Not on file  Social Connections: Not on file  Intimate Partner Violence: Not on file     Constitutional: Denies fever, malaise, fatigue, headache or  abrupt weight changes.  HEENT: Patient reports nasal congestion.  Denies eye pain, eye redness, ear pain, ringing in the ears, wax buildup, runny nose, bloody nose, or sore throat. Respiratory: Patient reports cough.  Denies difficulty breathing, shortness of breath, or sputum production.   Cardiovascular: Denies chest pain, chest tightness, palpitations or swelling in the hands or feet.  Neurological: Patient reports lightheadedness.  Denies dizziness, difficulty with memory, difficulty with speech or problems with balance and coordination.    No other specific complaints in a complete review of systems (except as listed in HPI above).  Objective:   Physical Exam   BP (!) 166/98   Pulse 97   Temp 97.8 F (36.6 C) (Temporal)   Wt 250 lb (113.4 kg)   SpO2 98%   BMI 36.92 kg/m   Wt  Readings from Last 3 Encounters:  07/02/20 252 lb 14.4 oz (114.7 kg)  10/09/19 252 lb (114.3 kg)  06/09/19 244 lb 2 oz (110.7 kg)    General: Appears his stated age, obese in NAD. HEENT: Head: normal shape and size; Nose: Mucosa dry and irritated, turbinates slightly swollen; Throat: No posterior pharynx erythema or exudate noted. Cardiovascular: Normal rate and rhythm. S1,S2 noted.  No murmur, rubs or gallops noted. No JVD or BLE edema.  Pulmonary/Chest: Normal effort and positive vesicular breath sounds. No respiratory distress. No wheezes, rales or ronchi noted.  Neurological: Alert and oriented.   BMET    Component Value Date/Time   NA 144 07/02/2020 0636   K 3.9 07/02/2020 0636   CL 103 07/02/2020 0636   CO2 30 10/03/2019 0734   GLUCOSE 113 (H) 07/02/2020 0636   BUN 18 07/02/2020 0636   CREATININE 1.10 07/02/2020 0636   CALCIUM 9.4 10/03/2019 0734   GFRNONAA >60 09/09/2016 0941   GFRAA >60 09/09/2016 0941    Lipid Panel     Component Value Date/Time   CHOL 215 (H) 10/03/2019 0734   TRIG 280.0 (H) 10/03/2019 0734   HDL 28.60 (L) 10/03/2019 0734   CHOLHDL 8 10/03/2019 0734   VLDL 56.0 (H) 10/03/2019 0734    CBC    Component Value Date/Time   WBC 9.4 10/03/2019 0734   RBC 5.21 10/03/2019 0734   HGB 15.3 07/02/2020 0636   HCT 45.0 07/02/2020 0636   PLT 242.0 10/03/2019 0734   MCV 89.2 10/03/2019 0734   MCH 29.7 09/09/2016 0941   MCHC 33.5 10/03/2019 0734   RDW 13.8 10/03/2019 0734   LYMPHSABS 3.0 10/03/2019 0734   MONOABS 0.6 10/03/2019 0734   EOSABS 0.5 10/03/2019 0734   BASOSABS 0.1 10/03/2019 0734    Hgb A1C Lab Results  Component Value Date   HGBA1C 5.9 10/03/2019           Assessment & Plan:   Nasal congestion, cough secondary to Covid 19:  Clinically improving No indication of sinus infection at this time Continue to use nasal saline OTC Recommend coolmist humidifier in your home No indication for steroids or antibiotics at this  time  HTN:  Has not been controlled in years with Nadolol, will wean this by decreasing to half tablet daily x7 days then stopping Rx for Losartan 25 mg p.o. daily Reinforced DASH diet and exercise for weight loss  RTC in 2 weeks for BP check with PCP  Return precautions discussed Webb Silversmith, NP This visit occurred during the SARS-CoV-2 public health emergency.  Safety protocols were in place, including screening questions prior to the visit, additional usage of  staff PPE, and extensive cleaning of exam room while observing appropriate contact time as indicated for disinfecting solutions.

## 2021-01-08 ENCOUNTER — Telehealth: Payer: Self-pay | Admitting: *Deleted

## 2021-01-08 ENCOUNTER — Emergency Department (HOSPITAL_COMMUNITY)
Admission: EM | Admit: 2021-01-08 | Discharge: 2021-01-08 | Disposition: A | Discharge status: Home / Self Care | Payer: BC Managed Care – PPO | Attending: Emergency Medicine | Admitting: Emergency Medicine

## 2021-01-08 DIAGNOSIS — Z79899 Other long term (current) drug therapy: Secondary | ICD-10-CM | POA: Diagnosis not present

## 2021-01-08 DIAGNOSIS — Z7982 Long term (current) use of aspirin: Secondary | ICD-10-CM | POA: Insufficient documentation

## 2021-01-08 DIAGNOSIS — Z9861 Coronary angioplasty status: Secondary | ICD-10-CM | POA: Insufficient documentation

## 2021-01-08 DIAGNOSIS — Z8616 Personal history of COVID-19: Secondary | ICD-10-CM | POA: Diagnosis not present

## 2021-01-08 DIAGNOSIS — R42 Dizziness and giddiness: Secondary | ICD-10-CM | POA: Insufficient documentation

## 2021-01-08 DIAGNOSIS — I10 Essential (primary) hypertension: Secondary | ICD-10-CM | POA: Diagnosis not present

## 2021-01-08 DIAGNOSIS — R457 State of emotional shock and stress, unspecified: Secondary | ICD-10-CM | POA: Diagnosis not present

## 2021-01-08 LAB — URINALYSIS, ROUTINE W REFLEX MICROSCOPIC
Bilirubin Urine: NEGATIVE
Glucose, UA: NEGATIVE mg/dL
Hgb urine dipstick: NEGATIVE
Ketones, ur: NEGATIVE mg/dL
Leukocytes,Ua: NEGATIVE
Nitrite: NEGATIVE
Protein, ur: NEGATIVE mg/dL
Specific Gravity, Urine: 1.012 (ref 1.005–1.030)
pH: 5 (ref 5.0–8.0)

## 2021-01-08 LAB — COMPREHENSIVE METABOLIC PANEL
ALT: 47 U/L — ABNORMAL HIGH (ref 0–44)
AST: 34 U/L (ref 15–41)
Albumin: 3.8 g/dL (ref 3.5–5.0)
Alkaline Phosphatase: 95 U/L (ref 38–126)
Anion gap: 9 (ref 5–15)
BUN: 8 mg/dL (ref 6–20)
CO2: 26 mmol/L (ref 22–32)
Calcium: 9.1 mg/dL (ref 8.9–10.3)
Chloride: 102 mmol/L (ref 98–111)
Creatinine, Ser: 0.87 mg/dL (ref 0.61–1.24)
GFR, Estimated: >60 mL/min (ref >60)
Glucose, Bld: 103 mg/dL — ABNORMAL HIGH (ref 70–99)
Potassium: 3.5 mmol/L (ref 3.5–5.1)
Sodium: 137 mmol/L (ref 135–145)
Total Bilirubin: 0.6 mg/dL (ref 0.3–1.2)
Total Protein: 7.3 g/dL (ref 6.5–8.1)

## 2021-01-08 LAB — CBC
HCT: 47.5 % (ref 39.0–52.0)
Hemoglobin: 15.5 g/dL (ref 13.0–17.0)
MCH: 28.9 pg (ref 26.0–34.0)
MCHC: 32.6 g/dL (ref 30.0–36.0)
MCV: 88.5 fL (ref 80.0–100.0)
Platelets: 236 10*3/uL (ref 150–400)
RBC: 5.37 MIL/uL (ref 4.22–5.81)
RDW: 12.8 % (ref 11.5–15.5)
WBC: 7.1 10*3/uL (ref 4.0–10.5)
nRBC: 0 % (ref 0.0–0.2)

## 2021-01-08 LAB — LIPASE, BLOOD: Lipase: 35 U/L (ref 11–51)
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
## 2021-01-08 NOTE — ED Triage Notes (Addendum)
Patient here for hypertension (history of same) after being diagnosed with COVID one week ago. States he has been taking cough medicine with a decongestant. Patient alert, oriented, and ambulatory at this time.

## 2021-01-08 NOTE — Discharge Instructions (Addendum)
Call your primary care doctor or specialist as discussed in the next 2-3 days.   Pressure remains greater than 183 systolic after 2 checks 1 hour apart, you can take a second dose of 25 mg losartan.  Return immediately back to the ER if:  Your symptoms worsen within the next 12-24 hours. You develop new symptoms such as new fevers, persistent vomiting, new pain, shortness of breath, or new weakness or numbness, or if you have any other concerns.

## 2021-01-08 NOTE — Telephone Encounter (Signed)
Patient called stating that he saw Webb Silversmith NP yesterday and she changed his blood pressure medication. Patient stated that he is 7 days out from having covid. Patient stated that he has a cough occasionally and his head feels a little funny. Patient stated that he is feeling lightheaded and jittery which comes and goes. Patient stated that he checked his blood pressure around 12:45 pm and it was 180/98. Patient stated that he checked his blood pressure about an hour later and it was 200/94. Patient wants to know how long it should be before the blood pressure medication starts to work. Patient denies, chest pain, SOB or difficulty breathing. Patient was given ER precautions and he verbalized understanding. Patient stated that he has been checking his pulse ox and it has been running around 97-98%.

## 2021-01-08 NOTE — Telephone Encounter (Signed)
It can take a few days to kick in. In the meantime, have him continue a whole tab of Nadolol

## 2021-01-09 MED ORDER — DIAZEPAM 2 MG PO TABS
2.0000 mg | ORAL_TABLET | Freq: Four times a day (QID) | ORAL | 0 refills | Status: DC | PRN
Start: 2021-01-09 — End: 2021-01-16

## 2021-01-09 NOTE — Addendum Note (Signed)
Addended by: Owens Loffler on: 01/09/2021 04:50 PM   Modules accepted: Orders

## 2021-01-09 NOTE — Telephone Encounter (Signed)
Pt left another VM at Triage asking for a call back ASAP, BP is still high and doesn't know what to do, pt is asking for call back today

## 2021-01-09 NOTE — Addendum Note (Signed)
Addended by: Carter Kitten on: 01/09/2021 05:09 PM   Modules accepted: Orders

## 2021-01-09 NOTE — Telephone Encounter (Signed)
Pt left message at Triage. He said his BP is running high he has seen Webb Silversmith, NP and also went to ER last night due to high BP. The ER mentioned maybe taking an extra BP med but pt wanted to make sure that's okay with our office before he does that. Pt said he flushed and jittery when his BP is high but he is aware that it is probably anxiety due to stress (spouse was very sick, pt had covid). Pt said all test run at ER was normal and eventually BP did come down while at ER but now it's back up. Pt checked BP at 10am and it was 170/93, I had pt check BP while on the phone with me and it was 194/98, pulse 68,  Note routed to PCP and Webb Silversmith, NP who eval pt

## 2021-01-09 NOTE — Telephone Encounter (Signed)
Mr. Brands notified as instructed by telephone. I verified with patient that his Losartan he has at home is 25 mg.  Per Dr. Lorelei Pont, patient can go ahead and take another losartan 25 mg now.   Dr. Lorelei Pont also sent in Valium for him yesterday.  Braidyn was notified to go pick up the Valium and take one tablet this evening.   Starting in the morning he is to take 2 Losartan tablets (50 mg) and check his blood pressure around lunch time tomorrow.  I ask that he call us back tomorrow with his blood pressure reading.  Patient states understanding.  Medication list updated.

## 2021-01-09 NOTE — Telephone Encounter (Signed)
Please call  Double losartan dosing, so he can take losartan 25 mg - take 2 tablets daily  I also sent him in # 5 tablets of Valium  Take one and if his BP comes down, then anxiety is playing a big role

## 2021-01-10 MED ORDER — HYDROCHLOROTHIAZIDE 12.5 MG PO CAPS: 12.5000 mg | ORAL_CAPSULE | Freq: Every day | ORAL | 2 refills | Status: DC

## 2021-01-10 NOTE — Telephone Encounter (Signed)
Pt said he has been taking med as instructed; this morning pt took Losartan 25 mg taking two tabs and yesterday took an extra losartan 25 mg and pt took BP today at lunch BP was 168/93 did not get P. pt took diazepam 2 mg on 01/09/21 around 5 PM. But has not taken Diazepam today.Pt still feeling cloudy headed on and off and sweaty palms and feet. No CP,SOB,H/A., dizziness and no vision changes. Pt wants to know what BS was when at [facility] ED on 01/08/21. What further instructions does Dr Lorelei Pont have for taking medications. Pt request cb after Dr Lorelei Pont reviews this note. [facility]. Sending note to Dr Lorelei Pont and Butch Penny CMA.

## 2021-01-10 NOTE — Telephone Encounter (Signed)
Mr. Clemon notified as instructed by telephone.  HCTZ 12.5 mg [facility] [place] as instructed by Dr. Lorelei Pont.  Appointment scheduled for 01/15/2021 at 2:40 pm with Dr. Lorelei Pont.

## 2021-01-10 NOTE — Telephone Encounter (Signed)
I think he needs to come back in for reevaluation.

## 2021-01-10 NOTE — Addendum Note (Signed)
Addended by: Carter Kitten on: 01/10/2021 03:58 PM   Modules accepted: Orders

## 2021-01-10 NOTE — Telephone Encounter (Signed)
Not reviewed. Can you give him his blood pressure from when he was in the ER.  Continue with losartan 50 mg PO daily.  Can you also send him in some hydrochlorothiazide 12.5 mg, one PO daily. He can take one this afternoon.  Please write down blood pressures twice a day. Make sure he has office visit next week.

## 2021-01-13 ENCOUNTER — Ambulatory Visit: Payer: BC Managed Care – PPO | Admitting: Family Medicine

## 2021-01-13 ENCOUNTER — Telehealth: Payer: Self-pay

## 2021-01-13 NOTE — Telephone Encounter (Signed)
I had reached out to [place] this morning because Dr. Lorelei Pont had a 2:40 pm cancellation,  to try and move his appointment up to today.  Truth states he couldn't be here until after 3:00 pm due to work  Appointment left for 01/15/2021 at 2:40 pm.

## 2021-01-13 NOTE — Telephone Encounter (Signed)
Per DOB and phone # should have been listed as pt Edgar Saunders. Pt seen  on 01/08/21. Pt already has appt with Dr Lorelei Pont on 01/15/21 for HTN. Is this appt OK to wait on painful urination? No available appts at [facility] on 01/13/21.

## 2021-01-13 NOTE — Telephone Encounter (Signed)
[place] Night - Client TELEPHONE ADVICE RECORD [facility] Patient Name: Edgar Saunders Gender: Male DOB: 1966/11/14 Age: 55 Y 17 M 24 D Return Phone Number: [phone] (Primary) Address: City/State/Zip: [place] [address] Client [facility] Night - Client Client Site [place] Physician Owens Loffler - MD Contact Type Call Who Is Calling Patient / Member / Family / Caregiver Call Type Triage / Clinical Relationship To Patient Self Return Phone Number [phone] (Primary) Chief Complaint Urination Pain Reason for Call Symptomatic / Request for [place] just recovering from covid and now thinks he has a bladder infection as he's having painful urination -- caller said he is also on blood pressure medication and needing something that does not mix with his medication Translation No Nurse Assessment Nurse: Delphina Cahill, RN, Santiago Glad Date/Time Eilene Ghazi Time): 01/12/2021 3:34:09 PM Confirm and document reason for call. If symptomatic, describe symptoms. ---Caller just recovering from covid and now thinks he has a bladder infection as he's having painful urination -- caller said he is also on blood pressure medication and needing something that does not mix with his medication. States BP was abnormal but getting back to normal. Is having burning with urination, frequency yesterday. Urine is dark yellow. 147/87 BP. That's the lowest it's been in a week. Does the patient have any new or worsening symptoms? ---Yes Will a triage be completed? ---Yes Related visit to physician within the last 2 weeks? ---Yes Does the PT have any chronic conditions? (i.e. diabetes, asthma, this includes High risk factors for pregnancy, etc.) ---Yes List chronic conditions. ---htn Is this a behavioral health or substance abuse call? ---No Guidelines Guideline Title Affirmed Question Affirmed  Notes Nurse Date/Time (Eastern Time) Urination Pain - Male All other males with painful urination Wandra Arthurs 01/12/2021 3:36:55 PM Disp. Time Eilene Ghazi Time) Disposition Final User 01/12/2021 3:39:56 PM See PCP within 24 Hours Yes Delphina Cahill, RN, Santiago Glad PLEASE NOTE: All timestamps contained within this report are represented as [place] Standard Time. CONFIDENTIALTY NOTICE: This fax transmission is intended only for the addressee. It contains information that is legally privileged, confidential or otherwise protected from use or disclosure. If you are not the intended recipient, you are strictly prohibited from reviewing, disclosing, copying using or disseminating any of this information or taking any action in reliance on or regarding this information. If you have received this fax in error, please notify us immediately by telephone so that we can arrange for its return to [place]. Phone: [phone], Toll-Free: [phone], Fax: [phone] Page: 2 of 2 Call Id: [id number] [place] Disagree/Comply Comply Caller Understands Yes PreDisposition Home Care Care Advice Given Per Guideline SEE PCP WITHIN 24 HOURS: * IF OFFICE WILL BE OPEN: You need to be examined within the next 24 hours. Call your doctor (or NP/PA) when the office opens and make an appointment. REASSURANCE AND EDUCATION: * Any man with painful urination needs to be checked. * It could be a urinary tract infection. Sometimes the urine is normal and the pain is caused by an infection of the penis or testicle. DRINK EXTRA FLUIDS: * Drink extra fluids. * Drink 8 to 10 cups (1,800 to 2,400 ml) of liquids a day. * Reason: This will water-down your urine and make it less painful to pass. If there is an infection, this will help wash out the germs from your bladder. PAIN MEDICINES: * For pain relief, you can take either acetaminophen, ibuprofen, or naproxen. *  They are over-the-counter (OTC) pain drugs. You can buy them at the drugstore. *  ACETAMINOPHEN - REGULAR STRENGTH TYLENOL: Take 650 mg (two 325 mg pills) by mouth every 4 to 6 hours as needed. Each Regular Strength Tylenol pill has 325 mg of acetaminophen. The most you should take each day is 3,250 mg (10 pills a day). * ACETAMINOPHEN - EXTRA STRENGTH TYLENOL: Take 1,000 mg (two 500 mg pills) every 8 hours as needed. Each Extra Strength Tylenol pill has 500 mg of acetaminophen. The most you should take each day is 3,000 mg (6 pills a day). * IBUPROFEN (E.G., MOTRIN, ADVIL): Take 400 mg (two 200 mg pills) by mouth every 6 hours. The most you should take each day is 1,200 mg (six 200 mg pills), unless your doctor has told you to take more. CALL BACK IF: * Fever over 100.4 F (38.0 C) occurs * Side (flank) or lower back pain occurs * You become worse CARE ADVICE given per Urination Pain - Male (Adult) guideline. Referrals REFERRED TO PCP OFFICE

## 2021-01-15 ENCOUNTER — Other Ambulatory Visit: Payer: Self-pay

## 2021-01-15 ENCOUNTER — Ambulatory Visit: Payer: BC Managed Care – PPO | Admitting: Family Medicine

## 2021-01-15 ENCOUNTER — Encounter: Payer: Self-pay | Admitting: Family Medicine

## 2021-01-15 VITALS — BP 140/84 | HR 71 | Temp 96.8°F | Ht 67.5 in | Wt 247.8 lb

## 2021-01-15 DIAGNOSIS — T44995A Adverse effect of other drug primarily affecting the autonomic nervous system, initial encounter: Secondary | ICD-10-CM

## 2021-01-15 DIAGNOSIS — I10 Essential (primary) hypertension: Secondary | ICD-10-CM | POA: Diagnosis not present

## 2021-01-15 DIAGNOSIS — U071 COVID-19: Secondary | ICD-10-CM | POA: Diagnosis not present

## 2021-01-15 MED ORDER — HYDROCHLOROTHIAZIDE 25 MG PO TABS: 25.0000 mg | ORAL_TABLET | Freq: Every day | ORAL | 1 refills | Status: DC

## 2021-01-15 MED ORDER — LOSARTAN POTASSIUM 50 MG PO TABS: 50.0000 mg | ORAL_TABLET | Freq: Every day | ORAL | 0 refills | Status: DC

## 2021-01-15 NOTE — Progress Notes (Unsigned)
Zlatan Hornback T. Imogine Carvell, MD, [place] at [facility] [place], [address]  Phone: [phone]  FAX: [place] - 55 y.o. male  MRN [id number]  Date of Birth: 1966/08/08  Date: 01/15/2021  PCP: Owens Loffler, MD  Referral: Owens Loffler, MD  Chief Complaint  Patient presents with  . Hypertension    This visit occurred during the SARS-CoV-2 public health emergency.  Safety protocols were in place, including screening questions prior to the visit, additional usage of staff PPE, and extensive cleaning of exam room while observing appropriate contact time as indicated for disinfecting solutions.   Subjective:   Edgar Saunders is a 55 y.o. very pleasant male patient with Body mass index is 38.23 kg/m. who presents with the following:  F/u HTN:  2 weeks ago had Covid  He has had some escalating blood pressures, and previously has had some borderline blood pressures.  At this point they have been quite high, and he had some systolics in the 242P.  He also felt extremely anxious and jittery, and ultimately he did go to the [facility], ER.  Via phone we have started him on losartan daily at 50 mg as well as hydrochlorothiazide 12.5 mg.  He did give some cough medication with Sudafed for his cough.  While taking this his blood pressure increased, and he felt a lot more anxious and jittery.  148/74, on my recheck in the office.  Now taking 50 mg of losartan daily HCTZ 12.5 mg  Wonders about covid, cough medication. Has been taking some sudafed.  Right now, he has been checking his blood pressure multiple times a day.  10/09/2019:  146/80 10/2018:  164/88 05/2018;  120/84  Review of Systems is noted in the HPI, as appropriate  Objective:   BP 140/84   Pulse 71   Temp (!) 96.8 F (36 C) (Temporal)   Ht 5' 7.5" (1.715 m)   Wt 247 lb 12 oz (112.4 kg)   SpO2  96%   BMI 38.23 kg/m   GEN: No acute distress; alert,appropriate. PULM: Breathing comfortably in no respiratory distress PSYCH: Normally interactive.  CV: RRR, no m/g/r  PULM: Normal respiratory rate, no accessory muscle use. No wheezes, crackles or rhonchi   Laboratory and Imaging Data:  Assessment and Plan:     ICD-10-CM   1. Essential hypertension  I10   2. Pseudoephedrine adverse reaction, initial encounter  T44.995A   3. COVID-19  U07.1    I suspect that he did have some degree of hypertension, and pseudoephedrine did exacerbate this quite a bit and induced acute anxiety.  He also has had acute COVID-19.  His blood pressure is stabilizing.  I did increase his hydrochlorothiazide to 25 mg.  I asked him to check his blood pressure no more than once weekly.  He is going to bring his blood pressure readings into the office, and we will revisit this in 1 month.  Social: At this point, he is having quite a bit of increased psychosocial stress at home.  Meds ordered this encounter  Medications  . losartan (COZAAR) 50 MG tablet    Sig: Take 1 tablet (50 mg total) by mouth daily.    Dispense:  1 tablet    Refill:  0  . hydrochlorothiazide (HYDRODIURIL) 25 MG tablet    Sig: Take 1 tablet (25 mg total) by mouth daily.  Dispense:  90 tablet    Refill:  1   Medications Discontinued During This Encounter  Medication Reason  . hydrochlorothiazide (MICROZIDE) 12.5 MG capsule   . losartan (COZAAR) 25 MG tablet Reorder  . diazepam (VALIUM) 2 MG tablet     Follow-up: Return in about 1 month (around 02/12/2021).  Signed,  Maud Deed. Axtyn Woehler, MD   Outpatient Encounter Medications as of 01/15/2021  Medication Sig  . alfuzosin (UROXATRAL) 10 MG 24 hr tablet Take 10 mg by mouth every evening.   Marland Kitchen aspirin 81 MG tablet Take 81 mg by mouth daily.  Marland Kitchen esomeprazole (NEXIUM) 40 MG capsule TAKE 1 CAPSULE BY MOUTH EVERY DAY AS NEEDED  . finasteride (PROSCAR) 5 MG tablet Take 1 tablet (5 mg  total) by mouth daily.  . hydrochlorothiazide (HYDRODIURIL) 25 MG tablet Take 1 tablet (25 mg total) by mouth daily.  . Multiple Vitamin (MULTIVITAMIN) tablet Take 1 tablet by mouth daily.  . [DISCONTINUED] diazepam (VALIUM) 2 MG tablet Take 1 tablet (2 mg total) by mouth every 6 (six) hours as needed for anxiety.  . [DISCONTINUED] hydrochlorothiazide (MICROZIDE) 12.5 MG capsule Take 1 capsule (12.5 mg total) by mouth daily.  . [DISCONTINUED] losartan (COZAAR) 25 MG tablet Take 50 mg by mouth daily.  Marland Kitchen losartan (COZAAR) 50 MG tablet Take 1 tablet (50 mg total) by mouth daily.   No facility-administered encounter medications on file as of 01/15/2021.

## 2021-01-22 ENCOUNTER — Ambulatory Visit: Payer: BC Managed Care – PPO | Admitting: Family Medicine

## 2021-01-23 ENCOUNTER — Ambulatory Visit: Payer: BC Managed Care – PPO | Admitting: Family Medicine

## 2021-01-24 ENCOUNTER — Other Ambulatory Visit: Payer: Self-pay | Admitting: Family Medicine

## 2021-02-12 ENCOUNTER — Encounter: Payer: Self-pay | Admitting: Family Medicine

## 2021-02-12 ENCOUNTER — Other Ambulatory Visit: Payer: Self-pay

## 2021-02-12 ENCOUNTER — Ambulatory Visit: Payer: BC Managed Care – PPO | Admitting: Family Medicine

## 2021-02-12 VITALS — BP 130/90 | HR 104 | Temp 97.9°F | Ht 67.5 in | Wt 249.0 lb

## 2021-02-12 DIAGNOSIS — I10 Essential (primary) hypertension: Secondary | ICD-10-CM

## 2021-02-12 DIAGNOSIS — Z79899 Other long term (current) drug therapy: Secondary | ICD-10-CM | POA: Diagnosis not present

## 2021-02-12 DIAGNOSIS — R000 Tachycardia, unspecified: Secondary | ICD-10-CM | POA: Diagnosis not present

## 2021-02-12 NOTE — Progress Notes (Signed)
Edgar Saunders T. Edgar Duross, MD, [place] at [facility] [place], [address]  Phone: [phone]  FAX: [place] - 55 y.o. male  MRN [id number]  Date of Birth: Apr 17, 1966  Date: 02/12/2021  PCP: Edgar Loffler, MD  Referral: Edgar Loffler, MD  Chief Complaint  Patient presents with  . Hypertension    This visit occurred during the SARS-CoV-2 public health emergency.  Safety protocols were in place, including screening questions prior to the visit, additional usage of staff PPE, and extensive cleaning of exam room while observing appropriate contact time as indicated for disinfecting solutions.   Subjective:   Edgar Saunders is a 55 y.o. very pleasant male patient with Body mass index is 38.42 kg/m. who presents with the following:  HTN: Tolerating all medications without side effects.  When we increase his hydrochlorothiazide to 25 mg, he did feel like he was dehydrated and urinating a lot. Stable and at goal No CP, no sob. No HA.  BP Readings from Last 3 Encounters:  02/12/21 130/90  01/15/21 140/84  01/08/21 (!) 102/72    Basic Metabolic Panel:    Component Value Date/Time   NA 137 01/08/2021 1842   K 3.5 01/08/2021 1842   CL 102 01/08/2021 1842   CO2 26 01/08/2021 1842   BUN 8 01/08/2021 1842   CREATININE 0.87 01/08/2021 1842   GLUCOSE 103 (H) 01/08/2021 1842   CALCIUM 9.1 01/08/2021 1842    130/90 today in the office.  On Cozaar 50 mg HCTZ at 12.5 mg  Was cramping a lot with  Started a low-carb diet.   EKG. he did have an elevated heart rate today in the office.  Review of Systems is noted in the HPI, as appropriate  Objective:   BP 130/90   Pulse (!) 104   Temp 97.9 F (36.6 C) (Temporal)   Ht 5' 7.5" (1.715 m)   Wt 249 lb (112.9 kg)   SpO2 94%   BMI 38.42 kg/m   GEN: No acute distress; alert,appropriate. PULM: Breathing  comfortably in no respiratory distress PSYCH: Normally interactive.  CV: Rate is at approximately 100 today in the office.  No murmurs or gallops or rubs  Laboratory and Imaging Data:  Assessment and Plan:     ICD-10-CM   1. Essential hypertension  Z36 Basic metabolic panel  2. Encounter for long-term current use of medication  U44.034 Basic metabolic panel  3. Tachycardia  R00.0 EKG 12-Lead   Stable blood pressure.  I think staying at 12.5 mg of hydrochlorothiazide is very reasonable.  He is going to work hard on losing weight, and he has a goal weight of 200 pounds.  EKG: Normal sinus rhythm, rate at 98.. Normal axis, normal R wave progression, No acute ST elevation or depression.  He does have 2 PVCs.  Check BMP after starting BP meds.  He denies any long-standing tachycardia or sensation of palpitations.  Orders Placed This Encounter  Procedures  . Basic metabolic panel  . EKG 12-Lead    Follow-up: No follow-ups on file.  Signed,  Edgar Deed. Shayda Kalka, MD   Outpatient Encounter Medications as of 02/12/2021  Medication Sig  . alfuzosin (UROXATRAL) 10 MG 24 hr tablet Take 10 mg by mouth every evening.   Marland Kitchen aspirin 81 MG tablet Take 81 mg by mouth daily.  Marland Kitchen esomeprazole (NEXIUM) 40 MG capsule TAKE 1 CAPSULE BY  MOUTH EVERY DAY AS NEEDED  . finasteride (PROSCAR) 5 MG tablet Take 1 tablet (5 mg total) by mouth daily.  . hydrochlorothiazide (HYDRODIURIL) 25 MG tablet Take 1 tablet (25 mg total) by mouth daily. (Patient taking differently: Take 12.5 mg by mouth daily.)  . losartan (COZAAR) 50 MG tablet Take 1 tablet (50 mg total) by mouth daily.  . Multiple Vitamin (MULTIVITAMIN) tablet Take 1 tablet by mouth daily.   No facility-administered encounter medications on file as of 02/12/2021.

## 2021-02-13 LAB — BASIC METABOLIC PANEL
BUN: 13 mg/dL (ref 6–23)
CO2: 31 mEq/L (ref 19–32)
Calcium: 9.7 mg/dL (ref 8.4–10.5)
Chloride: 99 mEq/L (ref 96–112)
Creatinine, Ser: 1 mg/dL (ref 0.40–1.50)
GFR: 84.3 mL/min (ref >60.00)
Glucose, Bld: 95 mg/dL (ref 70–99)
Potassium: 4 mEq/L (ref 3.5–5.1)
Sodium: 138 mEq/L (ref 135–145)

## 2021-03-03 ENCOUNTER — Other Ambulatory Visit: Payer: Self-pay | Admitting: Internal Medicine

## 2021-03-04 ENCOUNTER — Other Ambulatory Visit: Payer: Self-pay

## 2021-03-04 MED ORDER — LOSARTAN POTASSIUM 50 MG PO TABS: 50.0000 mg | ORAL_TABLET | Freq: Every day | ORAL | 1 refills | Status: DC

## 2021-03-04 NOTE — Telephone Encounter (Signed)
Pharmacy requests refill on: Losartan 50 mg   LAST REFILL: 01/15/2021 (Q-1, R-0) LAST OV: 02/12/2021 NEXT OV: Not Scheduled  PHARMACY: [facility] [place], [place] to refill?

## 2021-03-07 ENCOUNTER — Other Ambulatory Visit: Payer: Self-pay | Admitting: Family Medicine

## 2021-04-02 ENCOUNTER — Telehealth: Payer: Self-pay

## 2021-04-02 NOTE — Telephone Encounter (Signed)
Pt reports he has been having intermittent dizziness, usually in the morning and increased heart rate--has not been able to check BP and is not sure if medication is causing Sx... pt has appt 04/09/21 for f/u---advised to increase water intake as he said he felt better yesterday when he increased fluid intake...   Please advise

## 2021-04-02 NOTE — Telephone Encounter (Signed)
4/27 f/u is appropriate.

## 2021-04-07 ENCOUNTER — Other Ambulatory Visit: Payer: Self-pay | Admitting: Family Medicine

## 2021-04-07 ENCOUNTER — Telehealth: Payer: Self-pay

## 2021-04-07 NOTE — Telephone Encounter (Signed)
Received call from Lovena Le, [place] at [place], who reported pt called with palpitations and BP of 174/94 this morning, lightheadedness and dizziness for one wk. Pt reports he thinks his BP cuff is inaccurate.  Disposition is for pt to go to ER.   Talked to pt who c/o palpitations and slow HR since starting losartan and HCTZ two months ago. Palpitations for 2 months were occasional but recently are 10-15/min. He currently has had light-headedness and dizziness intermittently also. Reports BP recently at work checked by nurse of 130s/80s. He reports nurse at work told him to cut his dose of losartan in half, from 50mg  to 25mg  and BP has remained normal. He has been taking 25mg  for 4 days. Pt has not taken BP meds today yet because he is not working today and just woke up. Pt reports he has been increasing fluid intake since talking to office one wk ago with same symptoms.  Pt denies CP, radiating arm or jaw pain, nausea or SOB. Pt reports he feels fine except for the irregular HR. Advised pt to take BP med now and check BP in one hour. Pt inquired if he should take 25mg  or 50mg  losartan. Advised to continue the 25mg  and this nurse would f/u with him in 1.5 hours to get the BP readings. Advised of ER precautions. Pt verbalized understanding.

## 2021-04-07 NOTE — Telephone Encounter (Signed)
Pt reports BP of 159/87 and 147/77, HR 91 and 84. Also reports palpitations have lessened. Pt denies any new symptoms. Pt inquired if he should be seen sooner than 4/27. Advised pt to continue current regiment and keep apt on 04/09/21 but if anything changed to contact office. Advised of ER precautions.. Advised ok to go to work tomorrow. Pt verbalized understanding.

## 2021-04-07 NOTE — Telephone Encounter (Signed)
[place] [facility] - Client TELEPHONE ADVICE RECORD [facility] Patient Name: Edgar Saunders Gender: Male DOB: 06/24/1966 Age: 55 Y 101 M 19 D Return Phone Number: [phone] (Primary) Address: City/ State/ Zip: [place] [address] Client [place] [facility] - Client Client Site [place] - Day Physician [place], Frederico Hamman - MD Contact Type Call Who Is Calling Patient / Member / Family / Caregiver Call Type Triage / Clinical Relationship To Patient Self Return Phone Number [phone] (Primary) Chief Complaint Heart palpitations or irregular heartbeat Reason for Call Symptomatic / Request for Health Information Initial Comment Office is transferring a pt who is having a reaction to his BP Rx. He says his heart is skipping beats and also mentioned dizziness. Has appt for WED. No appts today available. Translation No Nurse Assessment Nurse: Jearld Pies, RN, Lovena Le Date/Time Eilene Ghazi Time): 04/07/2021 8:39:45 AM Confirm and document reason for call. If symptomatic, describe symptoms. ---Pt having heart palpitations with lightheadedness. Pt has been taking BP for a couple months. Palpitations started soon after taking medication. Lightheadedness comes and goes. Blurred vision on and off. Drinking and urinating normally. Denies difficulty breathing, chest pain, fever, or any other symptoms at this time. Does the patient have any new or worsening symptoms? ---Yes Will a triage be completed? ---Yes Related visit to physician within the last 2 weeks? ---No Does the PT have any chronic conditions? (i.e. diabetes, asthma, this includes High risk factors for pregnancy, etc.) ---Yes List chronic conditions. ---HTN, Enlarged Prostate Is this a behavioral health or substance abuse call? ---No Guidelines Guideline Title Affirmed Question Affirmed Notes Nurse Date/Time (Eastern Time) Blood Pressure - High [2] Systolic BP  >= 836 OR Diastolic >= 629 AND [4] cardiac or neurologic symptoms (e.g., chest pain, difficulty Jake Bathe 04/07/2021 8:47:30 AM PLEASE NOTE: All timestamps contained within this report are represented as [place] Standard Time. CONFIDENTIALTY NOTICE: This fax transmission is intended only for the addressee. It contains information that is legally privileged, confidential or otherwise protected from use or disclosure. If you are not the intended recipient, you are strictly prohibited from reviewing, disclosing, copying using or disseminating any of this information or taking any action in reliance on or regarding this information. If you have received this fax in error, please notify us immediately by telephone so that we can arrange for its return to [place]. Phone: [phone], Toll-Free: [phone], Fax: [phone] Page: 2 of 2 Call Id: [id number] Guidelines Guideline Title Affirmed Question Affirmed Notes Nurse Date/Time Eilene Ghazi Time) breathing, unsteady gait, blurred vision) Disp. Time Eilene Ghazi Time) Disposition Final User 04/07/2021 8:47:59 AM Go to ED Now Yes Jearld Pies, RN, Apolonio Schneiders Disagree/Comply Comply Caller Understands Yes PreDisposition Call Doctor Care Advice Given Per Guideline * Leave now. Drive carefully. * Go to the ED at ___________ [place] ED NOW: * You need to be seen in the Emergency Department. * You become worse * Becomes too weak to stand * Becomes confused CALL EMS 911 IF: * Passes out or faints Comments User: Malissa Hippo, RN Date/Time (Eastern Time): 04/07/2021 8:46:47 AM 174/94 currently upper arm HR 90 currently User: Malissa Hippo, RN Date/Time Eilene Ghazi Time): 04/07/2021 8:54:07 AM Advised nurse pt's symptoms and ED dispo. Nurse advised to warm transfer pt. Transfer successful. Referrals Warm transfer to backline

## 2021-04-09 ENCOUNTER — Other Ambulatory Visit: Payer: Self-pay

## 2021-04-09 ENCOUNTER — Ambulatory Visit: Payer: BC Managed Care – PPO | Admitting: Family Medicine

## 2021-04-09 ENCOUNTER — Encounter: Payer: Self-pay | Admitting: Family Medicine

## 2021-04-09 VITALS — BP 150/86 | HR 100 | Temp 98.1°F | Ht 67.5 in | Wt 239.2 lb

## 2021-04-09 DIAGNOSIS — I499 Cardiac arrhythmia, unspecified: Secondary | ICD-10-CM

## 2021-04-09 DIAGNOSIS — R001 Bradycardia, unspecified: Secondary | ICD-10-CM

## 2021-04-09 DIAGNOSIS — R002 Palpitations: Secondary | ICD-10-CM | POA: Diagnosis not present

## 2021-04-09 DIAGNOSIS — F411 Generalized anxiety disorder: Secondary | ICD-10-CM | POA: Diagnosis not present

## 2021-04-09 DIAGNOSIS — I10 Essential (primary) hypertension: Secondary | ICD-10-CM

## 2021-04-09 LAB — TIQ-MISC

## 2021-04-09 MED ORDER — HYDROCHLOROTHIAZIDE 12.5 MG PO CAPS: 12.5000 mg | ORAL_CAPSULE | Freq: Every day | ORAL | 1 refills | Status: DC
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
## 2021-04-10 ENCOUNTER — Telehealth: Payer: Self-pay

## 2021-04-10 ENCOUNTER — Telehealth: Payer: Self-pay | Admitting: Radiology

## 2021-04-10 LAB — BASIC METABOLIC PANEL
BUN: 13 mg/dL (ref 6–23)
CO2: 33 mEq/L — ABNORMAL HIGH (ref 19–32)
Calcium: 10.8 mg/dL — ABNORMAL HIGH (ref 8.4–10.5)
Chloride: 95 mEq/L — ABNORMAL LOW (ref 96–112)
Creatinine, Ser: 1.17 mg/dL (ref 0.40–1.50)
GFR: 69.75 mL/min (ref >60.00)
Glucose, Bld: 102 mg/dL — ABNORMAL HIGH (ref 70–99)
Potassium: 3.9 mEq/L (ref 3.5–5.1)
Sodium: 138 mEq/L (ref 135–145)

## 2021-04-10 LAB — TSH: TSH: 2.16 u[IU]/mL (ref 0.35–4.50)

## 2021-04-10 LAB — T4, FREE: Free T4: 0.98 ng/dL (ref 0.60–1.60)

## 2021-04-10 LAB — CBC WITH DIFFERENTIAL/PLATELET
Basophils Absolute: 0.1 10*3/uL (ref 0.0–0.1)
Basophils Relative: 1 % (ref 0.0–3.0)
Eosinophils Absolute: 0.2 10*3/uL (ref 0.0–0.7)
Eosinophils Relative: 1.2 % (ref 0.0–5.0)
HCT: 46.9 % (ref 39.0–52.0)
Hemoglobin: 15.8 g/dL (ref 13.0–17.0)
Lymphocytes Relative: 21.6 % (ref 12.0–46.0)
Lymphs Abs: 2.9 10*3/uL (ref 0.7–4.0)
MCHC: 33.7 g/dL (ref 30.0–36.0)
MCV: 87.1 fl (ref 78.0–100.0)
Monocytes Absolute: 0.9 10*3/uL (ref 0.1–1.0)
Monocytes Relative: 6.5 % (ref 3.0–12.0)
Neutro Abs: 9.4 10*3/uL — ABNORMAL HIGH (ref 1.4–7.7)
Neutrophils Relative %: 69.7 % (ref 43.0–77.0)
Platelets: 230 10*3/uL (ref 150.0–400.0)
RBC: 5.39 Mil/uL (ref 4.22–5.81)
RDW: 13.3 % (ref 11.5–15.5)
WBC: 13.4 10*3/uL — ABNORMAL HIGH (ref 4.0–10.5)

## 2021-04-10 LAB — HEPATIC FUNCTION PANEL
ALT: 30 U/L (ref 0–53)
AST: 22 U/L (ref 0–37)
Albumin: 4.6 g/dL (ref 3.5–5.2)
Alkaline Phosphatase: 102 U/L (ref 39–117)
Bilirubin, Direct: 0.1 mg/dL (ref 0.0–0.3)
Total Bilirubin: 0.5 mg/dL (ref 0.2–1.2)
Total Protein: 7.8 g/dL (ref 6.0–8.3)

## 2021-04-10 LAB — TROPONIN I (HIGH SENSITIVITY): High Sens Troponin I: 4 ng/L (ref 2–17)

## 2021-04-10 LAB — T3, FREE: T3, Free: 4.1 pg/mL (ref 2.3–4.2)

## 2021-04-10 NOTE — Telephone Encounter (Signed)
[facility] will do the Troponin, it is stable frozen or for 48 hrs refrigerated. Sample has just been picked up and will be ran stat

## 2021-04-10 NOTE — Telephone Encounter (Signed)
reviewed

## 2021-04-10 NOTE — Telephone Encounter (Signed)
Issue has been resolved.

## 2021-04-10 NOTE — Telephone Encounter (Signed)
[place] Night - Client Nonclinical Telephone Record AccessNurse Client [place] Night - Client Client Site [place] Physician Owens Loffler - MD Contact Type Call Who Is Calling Lab Lab Name [facility] Lab Phone Number [phone] Lab Tech Name [place] Patient Name Edgar Saunders Patient DOB [birth year] Call Type Lab Message Only Reason for Call Request for physician to clarify order Initial Comment Caller states she is calling from [facility] about a stat order. She has a question about the order. Disp. Time Disposition Final User 04/09/2021 6:53:09 PM General Information Provided Yes Rica Mote Call Closed By: Rica Mote Transaction Date/Time: 04/09/2021 6:49:56 PM (ET)

## 2021-04-10 NOTE — Telephone Encounter (Signed)
Sending note to Terri in lab and Butch Penny CMA.

## 2021-04-11 ENCOUNTER — Other Ambulatory Visit: Payer: Self-pay

## 2021-04-11 ENCOUNTER — Ambulatory Visit: Payer: BC Managed Care – PPO | Admitting: Cardiology

## 2021-04-11 ENCOUNTER — Ambulatory Visit (INDEPENDENT_AMBULATORY_CARE_PROVIDER_SITE_OTHER): Payer: BC Managed Care – PPO

## 2021-04-11 ENCOUNTER — Encounter: Payer: Self-pay | Admitting: Cardiology

## 2021-04-11 VITALS — BP 124/66 | HR 77 | Ht 67.0 in | Wt 238.0 lb

## 2021-04-11 DIAGNOSIS — I499 Cardiac arrhythmia, unspecified: Secondary | ICD-10-CM

## 2021-04-11 DIAGNOSIS — I493 Ventricular premature depolarization: Secondary | ICD-10-CM | POA: Diagnosis not present

## 2021-04-11 DIAGNOSIS — I10 Essential (primary) hypertension: Secondary | ICD-10-CM

## 2021-04-11 MED ORDER — METOPROLOL SUCCINATE ER 25 MG PO TB24: 25.0000 mg | ORAL_TABLET | Freq: Every day | ORAL | 5 refills | Status: DC
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
## 2021-04-11 NOTE — Patient Instructions (Signed)
Medication Instructions:   Your physician has recommended you make the following change in your medication:   START taking Toprol XL (Metoprolol Succinate) 25 MG once a day.  *If you need a refill on your cardiac medications before your next appointment, please call your pharmacy*   Lab Work:  None ordered   Testing/Procedures:   1.  Your physician has recommended that you wear a Zio monitor XT for one week.   This monitor is a medical device that records the heart's electrical activity. Doctors most often use these monitors to diagnose arrhythmias. Arrhythmias are problems with the speed or rhythm of the heartbeat. The monitor is a small device applied to your chest. You can wear one while you do your normal daily activities. While wearing this monitor if you have any symptoms to push the button and record what you felt. Once you have worn this monitor for the period of time provider prescribed (Usually 14 days), you will return the monitor device in the postage paid box. Once it is returned they will download the data collected and provide [place] with a report which the provider will then review and we will call you with those results. Important tips:  1. Avoid showering during the first 24 hours of wearing the monitor. 2. Avoid excessive sweating to help maximize wear time. 3. Do not submerge the device, no hot tubs, and no swimming pools. 4. Keep any lotions or oils away from the patch. 5. After 24 hours you may shower with the patch on. Take brief showers with your back facing the shower head.  6. Do not remove patch once it has been placed because that will interrupt data and decrease adhesive wear time. 7. Push the button when you have any symptoms and write down what you were feeling. 8. Once you have completed wearing your monitor, remove and place into box which has postage paid and place in your outgoing mailbox.  9. If for some reason you have misplaced your box then call our  office and we can provide another box and/or mail it off for you.    Follow-Up: At [facility], you and your health needs are our priority.  As part of our continuing mission to provide you with exceptional heart care, we have created designated Provider Care Teams.  These Care Teams include your primary Cardiologist (physician) and Advanced Practice Providers (APPs -  Physician Assistants and Nurse Practitioners) who all work together to provide you with the care you need, when you need it.  We recommend signing up for the patient portal called "MyChart".  Sign up information is provided on this After Visit Summary.  MyChart is used to connect with patients for Virtual Visits (Telemedicine).  Patients are able to view lab/test results, encounter notes, upcoming appointments, etc.  Non-urgent messages can be sent to your provider as well.   To learn more about what you can do with MyChart, go to [email or web address].    Your next appointment:   5-6 week(s)  The format for your next appointment:   In Person  Provider:   Kate Sable, MD   Other Instructions

## 2021-04-18 ENCOUNTER — Telehealth: Payer: Self-pay

## 2021-04-18 LAB — EXTRA SPECIMEN

## 2021-04-18 LAB — TROPONIN T, HIGH SENSITIVITY (HS-TNT)

## 2021-04-18 NOTE — Telephone Encounter (Signed)
Harlene Ramus with [facility] has test in question. Was collected on 04/09/21. Ref # [id number]. This info was alerady faxed to [phone]. Sending note to Terri and Irene Shipper in Lab; [place] RN admin aware. Terri and Irene Shipper are gone. I spoke with Earlie Server  at [facility]. To let her know above info . Earlie Server said it is in ref to frozen serum unsuitable for troponin; test was not performed. Earlie Server said they still have specimen and will hold as frozen specimen until receives call on 04/21/21. Per lab tab specimen was run and reported and Dr Lorelei Pont has commented. Dr Lorelei Pont was not in office but Dr Darnell Level walked by my desk and looked at resulted troponin and said to send to lab and Dr Lorelei Pont for 04/21/21. Sending also to [employer].

## 2021-04-20 NOTE — Telephone Encounter (Signed)
I already took care of this days ago. This message is of no consequence.

## 2021-04-21 NOTE — Telephone Encounter (Signed)
I faxed them back a document stating that we were aware and to cancel test on their end.

## 2021-05-23 ENCOUNTER — Ambulatory Visit: Payer: BC Managed Care – PPO | Admitting: Cardiology

## 2021-08-21 ENCOUNTER — Other Ambulatory Visit: Payer: Self-pay

## 2021-08-21 ENCOUNTER — Encounter: Payer: Self-pay | Admitting: Family Medicine

## 2021-08-21 ENCOUNTER — Ambulatory Visit: Payer: BC Managed Care – PPO | Admitting: Family Medicine

## 2021-08-21 VITALS — BP 148/80 | HR 84 | Temp 98.1°F | Ht 67.5 in | Wt 250.2 lb

## 2021-08-21 DIAGNOSIS — M5416 Radiculopathy, lumbar region: Secondary | ICD-10-CM | POA: Diagnosis not present

## 2021-08-21 MED ORDER — PREDNISONE 20 MG PO TABS: ORAL_TABLET | ORAL | 0 refills | Status: DC

## 2021-08-21 NOTE — Progress Notes (Signed)
Edgar Wymer T. [name], MD, [name] at [facility] [name], [address]  Phone: [phone]  FAX: [name] - 55 y.o. male  MRN [id number]  Date of Birth: Nov 13, 1966  Date: 08/21/2021  PCP: Owens Loffler, MD  Referral: Owens Loffler, MD  Chief Complaint  Patient presents with   Hip Pain    ?Sciatica-taking Wife's Prednison and Celebrex    This visit occurred during the SARS-CoV-2 public health emergency.  Safety protocols were in place, including screening questions prior to the visit, additional usage of staff PPE, and extensive cleaning of exam room while observing appropriate contact time as indicated for disinfecting solutions.   Subjective:   Edgar Saunders is a 55 y.o. very pleasant male patient who presents with the following: Back Pain  ongoing for approximately: 3-4 d The patient has had back pain before. The back pain is localized into the lumbar spine area with L radiculopathy to the ankle.   Took 10 mg of pred x 3 d  Ice and heat ? If it helped much No numbness or weeks  No numbness or tingling. No bowel or bladder incontinence. No focal weakness. Prior interventions: none Physical therapy: No Acupuncture: No Osteopathic manipulation: No Heat or cold: Minimal effect   Review of Systems is noted in the HPI, as appropriate  Objective:   Blood pressure (!) 148/80, pulse 84, temperature 98.1 F (36.7 C), temperature source Temporal, height 5' 7.5" (1.715 m), weight 250 lb 4 oz (113.5 kg), SpO2 96 %.  GEN: No acute distress; alert,appropriate. PULM: Breathing comfortably in no respiratory distress PSYCH: Normally interactive.   Range of motion at  the waist: Flexion, rotation and lateral bending: Flexion to beyond knees, extension is somewhat painful and lateral bending and rotation are normal.  No echymosis or edema Rises to examination table with no difficulty Gait:  minimally antalgic  Inspection/Deformity: No abnormality Paraspinus T: Predominantly on the left side from L4-S1  B Ankle Dorsiflexion (L5,4): 5/5 B Great Toe Dorsiflexion (L5,4): 5/5 Heel Walk (L5): WNL Toe Walk (S1): WNL Rise/Squat (L4): WNL, mild pain  SENSORY B Medial Foot (L4): WNL B Dorsum (L5): WNL B Lateral (S1): WNL Light Touch: WNL Pinprick: WNL  REFLEXES Knee (L4): 2+ Ankle (S1): 2+  B SLR, seated: neg B SLR, supine: neg B FABER: Positive on the left B Reverse FABER: neg B Greater Troch: NT B Log Roll: neg B Stork: NT B Sciatic Notch: NT  Radiology: No results found.  Assessment and Plan:     ICD-10-CM   1. Lumbar radiculopathy, acute  M54.16      Acute back pain with prior back pain with discogenic versus piriformis origin, nevertheless with sciatica/radicular symptoms.  Anatomy reviewed. Conservative algorithms for acute back pain generally begin with the following: NSAIDS, Muscle Relaxants, Mild pain medication.  In this case, I am going to give him some steroids. Start with medications, core rehab, and progress from there following low back pain algorithm. No red flags are present.  Follow-up: No follow-ups on file.  Meds ordered this encounter  Medications   predniSONE (DELTASONE) 20 MG tablet    Sig: 2 tabs po daily for 5 days, then 1 tab po daily for 5 days    Dispense:  15 tablet    Refill:  0   There are no discontinued medications. No orders of the defined types were placed in this encounter.   Signed,  Dhyana Bastone T. Peachie Barkalow, MD   Outpatient Encounter Medications as of 08/21/2021  Medication Sig   alfuzosin (UROXATRAL) 10 MG 24 hr tablet Take 10 mg by mouth every evening.    aspirin 81 MG tablet Take 81 mg by mouth daily.   esomeprazole (NEXIUM) 40 MG capsule TAKE 1 CAPSULE BY MOUTH EVERY DAY AS NEEDED   finasteride (PROSCAR) 5 MG tablet Take 1 tablet (5 mg total) by mouth daily.   hydrochlorothiazide (MICROZIDE) 12.5 MG capsule  Take 1 capsule (12.5 mg total) by mouth daily.   losartan (COZAAR) 50 MG tablet Take 1 tablet (50 mg total) by mouth daily.   metoprolol succinate (TOPROL XL) 25 MG 24 hr tablet Take 1 tablet (25 mg total) by mouth daily.   Multiple Vitamin (MULTIVITAMIN) tablet Take 1 tablet by mouth daily.   predniSONE (DELTASONE) 20 MG tablet 2 tabs po daily for 5 days, then 1 tab po daily for 5 days   No facility-administered encounter medications on file as of 08/21/2021.

## 2021-08-28 ENCOUNTER — Other Ambulatory Visit: Payer: Self-pay | Admitting: Family Medicine

## 2021-08-30 ENCOUNTER — Other Ambulatory Visit: Payer: Self-pay | Admitting: Family Medicine

## 2021-10-01 ENCOUNTER — Other Ambulatory Visit: Payer: Self-pay

## 2021-10-01 ENCOUNTER — Ambulatory Visit: Payer: BC Managed Care – PPO | Admitting: Nurse Practitioner

## 2021-10-01 VITALS — BP 158/86 | HR 92 | Temp 98.1°F | Resp 14 | Ht 67.5 in | Wt 258.1 lb

## 2021-10-01 DIAGNOSIS — H6123 Impacted cerumen, bilateral: Secondary | ICD-10-CM | POA: Diagnosis not present

## 2021-10-01 DIAGNOSIS — H6121 Impacted cerumen, right ear: Secondary | ICD-10-CM

## 2021-10-01 NOTE — Patient Instructions (Signed)
Nice to see you today Ear canal maybe sore over the next couple days but should resolve Follow up if symptoms fail to improve or worsen
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
## 2021-10-01 NOTE — Assessment & Plan Note (Signed)
Consent obtained from patient prior to irrigation.  Irrigation was performed on bilateral ears Cordis with office policy solution of water and peroxide use after using cerumen softening eardrops.  Patient did have a moment of "lightheadedness" that self resolved after taking a break of irrigating.  Patient was able to return to procedure and tolerate the rest the procedure without complaint or incident.  TMs look good on reevaluation.  Right ear canal slightly reddened did discuss with patient possibility of being sore and to keep an eye on it.  Follow-up if symptoms worsen or do not stay improved.  Hearing back to baseline per patient's report.

## 2021-10-03 ENCOUNTER — Other Ambulatory Visit: Payer: Self-pay | Admitting: *Deleted

## 2021-10-03 MED ORDER — METOPROLOL SUCCINATE ER 25 MG PO TB24: 25.0000 mg | ORAL_TABLET | Freq: Every day | ORAL | 0 refills | Status: DC

## 2021-10-03 NOTE — Telephone Encounter (Signed)
Refill for Metoprolol sent in for #30 and 0 refills. Patient is overdue for appointment in clinic, need to be seen for further refills.

## 2021-10-30 ENCOUNTER — Other Ambulatory Visit: Payer: Self-pay | Admitting: *Deleted

## 2021-10-30 MED ORDER — METOPROLOL SUCCINATE ER 25 MG PO TB24: 25.0000 mg | ORAL_TABLET | Freq: Every day | ORAL | 0 refills | Status: DC

## 2021-10-30 NOTE — Telephone Encounter (Signed)
Please contact patient to schedule follow up appointment, overdue for follow up from 03/2021 visit.   15 day supply of Metoprolol sent to [facility] in [place].

## 2021-10-31 NOTE — Telephone Encounter (Signed)
Mailbox full

## 2021-11-12 ENCOUNTER — Telehealth: Payer: Self-pay | Admitting: Cardiology

## 2021-11-12 NOTE — Telephone Encounter (Signed)
*  STAT* If patient is at the pharmacy, call can be transferred to refill team.   1. Which medications need to be refilled? (please list name of each medication and dose if known) Metoprolol 25 mg  1 tablet daily  2. Which pharmacy/location (including street and city if local pharmacy) is medication to be sent to?[facility] [place]  3. Do they need a 30 day or 90 day supply? 90day

## 2021-11-13 MED ORDER — METOPROLOL SUCCINATE ER 25 MG PO TB24: 25.0000 mg | ORAL_TABLET | Freq: Every day | ORAL | 0 refills | Status: DC

## 2021-11-13 NOTE — Telephone Encounter (Signed)
Requested Prescriptions   Signed Prescriptions Disp Refills   metoprolol succinate (TOPROL XL) 25 MG 24 hr tablet 30 tablet 0    Sig: Take 1 tablet (25 mg total) by mouth daily. NO FURTHER REFILLS [place] Provider: Kate Sable    Ordering User: NEWCOMER MCCLAIN, Stacey Sago L

## 2021-12-09 ENCOUNTER — Encounter: Payer: Self-pay | Admitting: Cardiology

## 2021-12-09 ENCOUNTER — Ambulatory Visit: Payer: BC Managed Care – PPO | Admitting: Cardiology

## 2021-12-09 ENCOUNTER — Other Ambulatory Visit: Payer: Self-pay

## 2021-12-09 VITALS — BP 150/80 | HR 95 | Ht 69.0 in | Wt 259.1 lb

## 2021-12-09 DIAGNOSIS — E782 Mixed hyperlipidemia: Secondary | ICD-10-CM

## 2021-12-09 DIAGNOSIS — I10 Essential (primary) hypertension: Secondary | ICD-10-CM | POA: Diagnosis not present

## 2021-12-09 DIAGNOSIS — I493 Ventricular premature depolarization: Secondary | ICD-10-CM | POA: Diagnosis not present

## 2021-12-09 NOTE — Patient Instructions (Signed)
Medication Instructions:   Your physician recommends that you continue on your current medications as directed. Please refer to the Current Medication list given to you today.   *If you need a refill on your cardiac medications before your next appointment, please call your pharmacy*   Lab Work:  Your physician recommends that you return for a FASTING lipid profile:  Friday 12/12/21  - You will need to be fasting. Please do not have anything to eat or drink after midnight the morning you have the lab work. You may only have water or black coffee with no cream or sugar.   Please return to our office on Friday 12/12/21 at ________________________    Testing/Procedures: None Ordered   Follow-Up: At [facility], you and your health needs are our priority.  As part of our continuing mission to provide you with exceptional heart care, we have created designated Provider Care Teams.  These Care Teams include your primary Cardiologist (physician) and Advanced Practice Providers (APPs -  Physician Assistants and Nurse Practitioners) who all work together to provide you with the care you need, when you need it.  We recommend signing up for the patient portal called "MyChart".  Sign up information is provided on this After Visit Summary.  MyChart is used to connect with patients for Virtual Visits (Telemedicine).  Patients are able to view lab/test results, encounter notes, upcoming appointments, etc.  Non-urgent messages can be sent to your provider as well.   To learn more about what you can do with MyChart, go to [email or web address].    Your next appointment:   2 month(s)  The format for your next appointment:   In Person  Provider:   You may see Kate Sable, MD or one of the following Advanced Practice Providers on your designated Care Team:   Murray Hodgkins, NP Christell Faith, PA-C Cadence Kathlen Mody, [place]    Other Instructions
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
## 2021-12-11 NOTE — Addendum Note (Signed)
Addended by: Anselm Pancoast on: 12/11/2021 11:39 AM   Modules accepted: Orders

## 2021-12-12 ENCOUNTER — Other Ambulatory Visit: Payer: Self-pay

## 2021-12-12 ENCOUNTER — Other Ambulatory Visit (INDEPENDENT_AMBULATORY_CARE_PROVIDER_SITE_OTHER): Payer: BC Managed Care – PPO

## 2021-12-12 DIAGNOSIS — E782 Mixed hyperlipidemia: Secondary | ICD-10-CM

## 2021-12-13 LAB — LIPID PANEL
Chol/HDL Ratio: 6.8 ratio — ABNORMAL HIGH (ref 0.0–5.0)
Cholesterol, Total: 224 mg/dL — ABNORMAL HIGH (ref 100–199)
HDL: 33 mg/dL — ABNORMAL LOW (ref >39)
LDL Chol Calc (NIH): 154 mg/dL — ABNORMAL HIGH (ref 0–99)
Triglycerides: 203 mg/dL — ABNORMAL HIGH (ref 0–149)
VLDL Cholesterol Cal: 37 mg/dL (ref 5–40)

## 2021-12-16 ENCOUNTER — Other Ambulatory Visit: Payer: Self-pay

## 2021-12-16 MED ORDER — METOPROLOL SUCCINATE ER 25 MG PO TB24: 25.0000 mg | ORAL_TABLET | Freq: Every day | ORAL | 1 refills | Status: DC

## 2021-12-17 ENCOUNTER — Telehealth: Payer: Self-pay

## 2021-12-17 NOTE — Telephone Encounter (Signed)
Called patient and his VM box was full, was not able to leave a message.

## 2021-12-17 NOTE — Telephone Encounter (Signed)
Spoke with patient and he stated that he has put on about 25 pounds since November. He is not ready to try medication and wants to focus on diet and using his new elliptical machine to get his cholesterol down. We discussed healthy dietary choices. Patient has a follow up in February and will discuss this further with Dr. Garen Lah. Patient was grateful for the follow up call.

## 2021-12-17 NOTE — Telephone Encounter (Signed)
-----   Message from Kate Sable, MD sent at 12/15/2021 10:40 AM EST ----- Cholesterol elevated.  Start Lipitor 40 mg daily.

## 2022-01-06 ENCOUNTER — Other Ambulatory Visit: Payer: Self-pay

## 2022-01-06 ENCOUNTER — Telehealth: Payer: Self-pay | Admitting: Cardiology

## 2022-01-06 MED ORDER — HYDROCHLOROTHIAZIDE 12.5 MG PO CAPS: ORAL_CAPSULE | ORAL | 3 refills | Status: DC

## 2022-01-06 MED ORDER — METOPROLOL SUCCINATE ER 25 MG PO TB24: 25.0000 mg | ORAL_TABLET | Freq: Every day | ORAL | 3 refills | Status: DC

## 2022-01-06 NOTE — Telephone Encounter (Signed)
Requested Prescriptions   Signed Prescriptions Disp Refills   hydrochlorothiazide (MICROZIDE) 12.5 MG capsule 90 capsule 3    Sig: TAKE 1 CAPSULE BY MOUTH EVERY DAY    Authorizing Provider: Kate Sable    Ordering User: Janan Ridge   metoprolol succinate (TOPROL XL) 25 MG 24 hr tablet 90 tablet 3    Sig: Take 1 tablet (25 mg total) by mouth daily.    Authorizing Provider: Kate Sable    Ordering User: Janan Ridge

## 2022-01-06 NOTE — Telephone Encounter (Signed)
°*  STAT* If patient is at the pharmacy, call can be transferred to refill team.   1. Which medications need to be refilled? (please list name of each medication and dose if known) Toprol xl 25 mg 1 tablet daily, Microzine 12.5 mg   2. Which pharmacy/location (including street and city if local pharmacy) is medication to be sent to? [facility]  3. Do they need a 30 day or 90 day supply? 90day

## 2022-02-09 ENCOUNTER — Encounter: Payer: Self-pay | Admitting: Medical

## 2022-02-09 ENCOUNTER — Ambulatory Visit: Payer: BC Managed Care – PPO | Admitting: Medical

## 2022-02-09 ENCOUNTER — Other Ambulatory Visit: Payer: Self-pay

## 2022-02-09 ENCOUNTER — Ambulatory Visit: Payer: BC Managed Care – PPO | Admitting: Cardiology

## 2022-02-09 VITALS — BP 150/80 | HR 64 | Ht 68.0 in | Wt 254.0 lb

## 2022-02-09 DIAGNOSIS — I10 Essential (primary) hypertension: Secondary | ICD-10-CM

## 2022-02-09 DIAGNOSIS — E782 Mixed hyperlipidemia: Secondary | ICD-10-CM | POA: Diagnosis not present

## 2022-02-09 DIAGNOSIS — I493 Ventricular premature depolarization: Secondary | ICD-10-CM | POA: Diagnosis not present

## 2022-02-09 MED ORDER — LOSARTAN POTASSIUM 100 MG PO TABS: 100.0000 mg | ORAL_TABLET | Freq: Every day | ORAL | 3 refills | Status: DC

## 2022-02-09 NOTE — Patient Instructions (Signed)
Medication Instructions:  Your physician has recommended you make the following change in your medication:   INCREASE losartan (Cozaar) to 100 mg daily   *If you need a refill on your cardiac medications before your next appointment, please call your pharmacy*   Lab Work: None ordered  If you have labs (blood work) drawn today and your tests are completely normal, you will receive your results only by: Wink (if you have MyChart) OR A paper copy in the mail If you have any lab test that is abnormal or we need to change your treatment, we will call you to review the results.   Testing/Procedures: None ordered   Follow-Up: At [facility], you and your health needs are our priority.  As part of our continuing mission to provide you with exceptional heart care, we have created designated Provider Care Teams.  These Care Teams include your primary Cardiologist (physician) and Advanced Practice Providers (APPs -  Physician Assistants and Nurse Practitioners) who all work together to provide you with the care you need, when you need it.  We recommend signing up for the patient portal called "MyChart".  Sign up information is provided on this After Visit Summary.  MyChart is used to connect with patients for Virtual Visits (Telemedicine).  Patients are able to view lab/test results, encounter notes, upcoming appointments, etc.  Non-urgent messages can be sent to your provider as well.   To learn more about what you can do with MyChart, go to [email or web address].    Your next appointment:   3 month(s)  The format for your next appointment:   In Person  Provider:   You may see Kate Sable, MD or one of the following Advanced Practice Providers on your designated Care Team:   Murray Hodgkins, NP Christell Faith, PA-C Cadence Kathlen Mody, [place]   Other Instructions N/A
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
## 2022-05-12 ENCOUNTER — Encounter: Payer: Self-pay | Admitting: Medical

## 2022-05-12 ENCOUNTER — Ambulatory Visit: Payer: BC Managed Care – PPO | Admitting: Medical

## 2022-05-12 VITALS — BP 140/80 | HR 69 | Ht 69.0 in | Wt 264.2 lb

## 2022-05-12 DIAGNOSIS — I10 Essential (primary) hypertension: Secondary | ICD-10-CM | POA: Diagnosis not present

## 2022-05-12 DIAGNOSIS — I493 Ventricular premature depolarization: Secondary | ICD-10-CM

## 2022-05-12 DIAGNOSIS — E782 Mixed hyperlipidemia: Secondary | ICD-10-CM

## 2022-05-12 NOTE — Patient Instructions (Signed)
Medication Instructions:  ? ?Your physician recommends that you continue on your current medications as directed. Please refer to the Current Medication list given to you today. ? ?*If you need a refill on your cardiac medications before your next appointment, please call your pharmacy* ? ? ?Lab Work: ? ?None ordered ? ?Testing/Procedures: ? ?None ordered ? ? ?Follow-Up: ?At [facility], you and your health needs are our priority.  As part of our continuing mission to provide you with exceptional heart care, we have created designated Provider Care Teams.  These Care Teams include your primary Cardiologist (physician) and Advanced Practice Providers (APPs -  Physician Assistants and Nurse Practitioners) who all work together to provide you with the care you need, when you need it. ? ?We recommend signing up for the patient portal called "MyChart".  Sign up information is provided on this After Visit Summary.  MyChart is used to connect with patients for Virtual Visits (Telemedicine).  Patients are able to view lab/test results, encounter notes, upcoming appointments, etc.  Non-urgent messages can be sent to your provider as well.   ?To learn more about what you can do with MyChart, go to [email or web address].   ? ?Your next appointment:   ?6 month(s) ? ?The format for your next appointment:   ?In Person ? ?Provider:   ?You may see Brian Agbor-Etang, MD or one of the following Advanced Practice Providers on your designated Care Team:   ?Christopher Berge, NP ?Ryan Dunn, PA-C ?Cadence Furth, PA-C ? ?Important Information About Sugar ? ? ? ? ? ? ?
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
## 2022-08-12 DIAGNOSIS — R972 Elevated prostate specific antigen [PSA]: Secondary | ICD-10-CM | POA: Diagnosis not present

## 2022-08-12 DIAGNOSIS — N401 Enlarged prostate with lower urinary tract symptoms: Secondary | ICD-10-CM | POA: Diagnosis not present

## 2022-08-12 DIAGNOSIS — R3912 Poor urinary stream: Secondary | ICD-10-CM | POA: Diagnosis not present

## 2022-11-13 ENCOUNTER — Ambulatory Visit: Payer: BC Managed Care – PPO | Admitting: Cardiology

## 2023-01-01 ENCOUNTER — Ambulatory Visit: Payer: BC Managed Care – PPO | Admitting: Cardiology

## 2023-01-05 ENCOUNTER — Telehealth: Payer: Self-pay | Admitting: *Deleted

## 2023-01-05 NOTE — Progress Notes (Signed)
  Care Coordination   Note   01/05/2023 Name: Edgar Saunders MRN: [id number] DOB: 1966-08-30  Edgar Saunders is a 57 y.o. year old male who sees Copland, Frederico Hamman, MD for primary care. I reached out to Burna Sis by phone today to offer care coordination services.  [name] was given information about Care Coordination services today including:   The Care Coordination services include support from the care team which includes your Nurse Coordinator, Clinical Social Worker, or Pharmacist.  The Care Coordination team is here to help remove barriers to the health concerns and goals most important to you. Care Coordination services are voluntary, and the patient may decline or stop services at any time by request to their care team member.   Care Coordination Consent Status: Patient agreed to services and verbal consent obtained.   Follow up plan:  Telephone appointment with care coordination team member scheduled for:  01/11/2023  Encounter Outcome:  Pt. Scheduled  Julian Hy, [place] Direct Dial: [phone]

## 2023-01-11 ENCOUNTER — Telehealth: Payer: Self-pay

## 2023-01-11 NOTE — Patient Outreach (Signed)
  Care Coordination   01/11/2023 Name: Edgar Saunders MRN: [id number] DOB: 1966/09/10   Care Coordination Outreach Attempts:  An unsuccessful telephone outreach was attempted for a scheduled appointment today. Telephone call to patient. Initially contact answered but couldn't hear RNCM on the phone.  RNCM called back x 3.  Unable to reach. HIPAA compliant voice message left with call back phone number.   Follow Up Plan:  Additional outreach attempts will be made to offer the patient care coordination information and services.   Encounter Outcome:  No Answer   Care Coordination Interventions:  No, not indicated    Quinn Plowman [facility] [place] [phone] direct line

## 2023-01-14 ENCOUNTER — Other Ambulatory Visit: Payer: Self-pay

## 2023-01-14 MED ORDER — METOPROLOL SUCCINATE ER 25 MG PO TB24: 25.0000 mg | ORAL_TABLET | Freq: Every day | ORAL | 0 refills | Status: DC

## 2023-01-15 ENCOUNTER — Telehealth: Payer: Self-pay | Admitting: *Deleted

## 2023-01-15 NOTE — Progress Notes (Signed)
  Care Coordination Note  01/15/2023 Name: Edgar Saunders MRN: [id number] DOB: February 11, 1966  Edgar Saunders is a 57 y.o. year old male who is a primary care patient of Copland, Frederico Hamman, MD and is actively engaged with the care management team. I reached out to Burna Sis by phone today to assist with re-scheduling an initial visit with the RN Case Manager  Follow up plan: Unsuccessful telephone outreach attempt made. A HIPAA compliant phone message was left for the patient providing contact information and requesting a return call.   Julian Hy, [place] Direct Dial: [phone]

## 2023-01-15 NOTE — Progress Notes (Signed)
  Care Coordination Note  01/15/2023 Name: Edgar Saunders MRN: [id number] DOB: [birth year]  Edgar Saunders is a 57 y.o. year old male who is a primary care patient of Copland, Frederico Hamman, MD and is actively engaged with the care management team. I reached out to Burna Sis by phone today to assist with re-scheduling an initial visit with the RN Case Manager  Follow up plan: Telephone appointment with care management team member scheduled for: 01/18/2023  Julian Hy, [place] Direct Dial: [phone]

## 2023-01-18 ENCOUNTER — Ambulatory Visit: Payer: Self-pay

## 2023-01-18 ENCOUNTER — Telehealth: Payer: Self-pay

## 2023-01-18 NOTE — Patient Outreach (Signed)
  Care Coordination   01/18/2023 Name: Edgar Saunders MRN: [id number] DOB: 01-Aug-1966   Care Coordination Outreach Attempts:  An unsuccessful telephone outreach was attempted for a scheduled appointment today. HIPAA compliant message left with spouse.  Wife requested return call to patient.  Follow Up Plan:  Additional outreach attempts will be made to offer the patient care coordination information and services.   Encounter Outcome:   wifte/ Pt. Request to Call Back   Care Coordination Interventions:  No, not indicated    Quinn Plowman [facility] [place] [phone] direct line

## 2023-01-18 NOTE — Patient Outreach (Signed)
  Care Coordination   Follow Up Visit Note   01/18/2023 Name: Edgar Saunders MRN: [id number] DOB: [birth year]  Edgar Saunders is a 57 y.o. year old male who sees Copland, Frederico Hamman, MD for primary care. I spoke with  Burna Sis by phone today.  What matters to the patients health and wellness today?  Losing weight   Goals Addressed             This Visit's Progress    "Weight loss"       Care Coordination Interventions: Provided written education to patient re: provider recommended life style modifications  Evaluation of current treatment plan related to weight loss and patient's adherence to plan as established by provider:  Patient states he wants to loss some weight.  He states he is trying to make changes with his diet by stopping soft drinks and eating healthier.  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 He reports last blood pressure was 138/80. Patient states his blood pressure normally runs in the 130's/ 80's.  Reviewed medications with patient and discussed importance of compliance Reviewed scheduled/upcoming provider appointments: Per chart review patient scheduled for follow up with cardiologist on 01/29/23.  Discussed plans with patient for ongoing care management follow up and provided patient with direct contact information for care management team:  Patient verbally agreed to next telephone follow up with RNCM on 02/23/23.  Weight loss education article sent to patient in [place]. Advised to review and discuss with RNCM at next telephone outreach.              SDOH assessments and interventions completed:  Yes  SDOH Interventions Today    Flowsheet Row Most Recent Value  SDOH Interventions   Food Insecurity Interventions Intervention Not Indicated  Housing Interventions Intervention Not Indicated  Transportation Interventions Intervention Not Indicated        Care Coordination Interventions:  Yes, provided    Interventions Today    Flowsheet Row Most Recent Value  Chronic Disease Discussed/Reviewed   Chronic disease discussed/reviewed during today's visit Other, Hypertension (HTN)  [weight loss]  General Interventions   General Interventions Discussed/Reviewed General Interventions Discussed  [monitoring blood pressure regularly and recording, adhering to a healthy and low sodium diet, increasing physical activity]  Exercise Interventions   Exercise Discussed/Reviewed Physical Activity  Physical Activity Discussed/Reviewed Physical Activity Discussed  Education Interventions   Education Provided Provided Web-based Education  Nutrition Interventions   Nutrition Discussed/Reviewed Nutrition Discussed, Decreasing salt  [education article sent to patient on weight loss / healthy diet]       Follow up plan: Follow up call scheduled for 02/23/23    Encounter Outcome:  Pt. Visit Completed   Quinn Plowman RN,BSN,CCM [place] [phone] direct line

## 2023-01-18 NOTE — Patient Instructions (Signed)
Visit Information  Thank you for taking time to visit with me today. Please don't hesitate to contact me if I can be of assistance to you.   Following are the goals we discussed today:   Goals Addressed             This Visit's Progress    "Weight loss"       Care Coordination Interventions: Provided written education to patient re: provider recommended life style modifications  Evaluation of current treatment plan related to weight loss and patient's adherence to plan as established by provider:   Reviewed medications with patient and discussed importance of compliance Reviewed scheduled/upcoming provider appointments:  Discussed plans with patient for ongoing care management follow up and provided patient with direct contact information for care management team:  Weight loss education article sent to patient in [place]. Advised to review and discuss with RNCM at next telephone outreach.              Our next appointment is by telephone on 02/23/23 at 4:00 pm  Please call the care guide team at [phone] if you need to cancel or reschedule your appointment.   If you are experiencing a Mental Health or [place] or need someone to talk to, please call the Suicide and Crisis Lifeline: 988 call 1-800-273-TALK (toll free, 24 hour hotline)  Patient verbalizes understanding of instructions and care plan provided today and agrees to view in [place]. Active MyChart status and patient understanding of how to access instructions and care plan via MyChart confirmed with patient.     Quinn Plowman RN,BSN,CCM [place] Coordination [phone] direct line

## 2023-01-29 ENCOUNTER — Ambulatory Visit: Payer: BC Managed Care – PPO | Admitting: Cardiology

## 2023-02-03 ENCOUNTER — Other Ambulatory Visit: Payer: Self-pay | Admitting: Medical

## 2023-02-04 ENCOUNTER — Other Ambulatory Visit: Payer: Self-pay

## 2023-02-04 MED ORDER — METOPROLOL SUCCINATE ER 25 MG PO TB24: 25.0000 mg | ORAL_TABLET | Freq: Every day | ORAL | 1 refills | Status: DC

## 2023-02-13 ENCOUNTER — Ambulatory Visit
Admission: RE | Admit: 2023-02-13 | Discharge: 2023-02-13 | Disposition: A | Discharge status: Home / Self Care | Payer: BC Managed Care – PPO | Source: Ambulatory Visit | Attending: Emergency Medicine | Admitting: Emergency Medicine

## 2023-02-13 VITALS — BP 168/102 | HR 109 | Temp 97.9°F | Resp 20

## 2023-02-13 DIAGNOSIS — J0100 Acute maxillary sinusitis, unspecified: Secondary | ICD-10-CM

## 2023-02-13 MED ORDER — AMOXICILLIN-POT CLAVULANATE 875-125 MG PO TABS: 1.0000 | ORAL_TABLET | Freq: Two times a day (BID) | ORAL | 0 refills | Status: AC
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
## 2023-02-13 NOTE — Discharge Instructions (Signed)
You are being treated for sinus infection  Take Augmentin every morning every evening for 10 days, daily will see improvement in about 48 hours and steady progression from 3 days  You may continue use of over-the-counter medication such as saline mist and humidifier use in various other options to help minimize your symptoms    You can take Tylenol and/or Ibuprofen as needed for fever reduction and pain relief.   For cough: honey 1/2 to 1 teaspoon (you can dilute the honey in water or another fluid).  You can also use guaifenesin and dextromethorphan for cough. You can use a humidifier for chest congestion and cough.  If you don't have a humidifier, you can sit in the bathroom with the hot shower running.      For sore throat: try warm salt water gargles, cepacol lozenges, throat spray, warm tea or water with lemon/honey, popsicles or ice, or OTC cold relief medicine for throat discomfort.   For congestion: take a daily anti-histamine like Zyrtec, Claritin, and a oral decongestant, such as pseudoephedrine.  You can also use Flonase 1-2 sprays in each nostril daily.   It is important to stay hydrated: drink plenty of fluids (water, gatorade/powerade/pedialyte, juices, or teas) to keep your throat moisturized and help further relieve irritation/discomfort.

## 2023-02-13 NOTE — ED Triage Notes (Signed)
Pt reports some sinus drainage and pressure and cough x 3 weeks

## 2023-02-23 ENCOUNTER — Ambulatory Visit: Payer: Self-pay

## 2023-02-23 NOTE — Patient Outreach (Signed)
  Care Coordination   Follow Up Visit Note   02/23/2023 Name: KAYVAN HOEFLING MRN: [id number] DOB: Jul 12, 1966  RAHMIR BEEVER is a 57 y.o. year old male who sees Copland, Frederico Hamman, MD for primary care. I spoke with  Burna Sis by phone today.  What matters to the patients health and wellness today?  Patient states he is dong well. He reports a weight loss of 6-7 lbs.  Patient states he is walking outside everyday when weather permits.   Patient states he is eating cleaner and has decreased soft drinks.  Patient states he is able to manage working on his weight loss.  He declines need for further care coordination follow up.    Goals Addressed             This Visit's Progress    "Weight loss"       Interventions Today    Flowsheet Row Most Recent Value  Chronic Disease   Chronic disease during today's visit Other  [weight loss]  General Interventions   General Interventions Discussed/Reviewed General Interventions Reviewed  [place] assessed for weight loss.]  Exercise Interventions   Exercise Discussed/Reviewed Exercise Reviewed  [advised of importance of exercise during weight loss.  Encouraged patient to time self when walking and challenge self by increasing walking time or walking speed.]  Education Interventions   Education Provided Provided Education  Provided Verbal Education On Other  [weight loss strategies provided such as: reducing calories, eliminate processed foods, drink more water, try intermittent fasting, eat more protein and more fiber.]                  SDOH assessments and interventions completed:  No     Care Coordination Interventions:  Yes, provided   Follow up plan: No further intervention required.   Encounter Outcome:  Pt. Visit Completed   Quinn Plowman RN,BSN,CCM [place] [phone] direct line

## 2023-03-10 ENCOUNTER — Other Ambulatory Visit: Payer: Self-pay | Admitting: *Deleted

## 2023-03-10 MED ORDER — METOPROLOL SUCCINATE ER 25 MG PO TB24: 25.0000 mg | ORAL_TABLET | Freq: Every day | ORAL | 0 refills | Status: DC

## 2023-03-26 ENCOUNTER — Ambulatory Visit: Payer: BC Managed Care – PPO | Attending: Cardiology | Admitting: Cardiology

## 2023-03-26 ENCOUNTER — Encounter: Payer: Self-pay | Admitting: Cardiology

## 2023-03-26 VITALS — BP 160/82 | HR 98 | Ht 69.0 in | Wt 262.0 lb

## 2023-03-26 DIAGNOSIS — E782 Mixed hyperlipidemia: Secondary | ICD-10-CM | POA: Diagnosis not present

## 2023-03-26 DIAGNOSIS — I10 Essential (primary) hypertension: Secondary | ICD-10-CM

## 2023-03-26 DIAGNOSIS — I493 Ventricular premature depolarization: Secondary | ICD-10-CM

## 2023-03-26 MED ORDER — AMLODIPINE BESYLATE 5 MG PO TABS: 5.0000 mg | ORAL_TABLET | Freq: Every day | ORAL | 3 refills | Status: DC
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
## 2023-03-26 NOTE — Patient Instructions (Signed)
Medication Instructions:   START Amlodipine - take one tablet ( 5mg ) by mouth daily.   *If you need a refill on your cardiac medications before your next appointment, please call your pharmacy*   Lab Work:  None Ordered  If you have labs (blood work) drawn today and your tests are completely normal, you will receive your results only by: MyChart Message (if you have MyChart) OR A paper copy in the mail If you have any lab test that is abnormal or we need to change your treatment, we will call you to review the results.   Testing/Procedures:  None Ordered   Follow-Up: At [facility], you and your health needs are our priority.  As part of our continuing mission to provide you with exceptional heart care, we have created designated Provider Care Teams.  These Care Teams include your primary Cardiologist (physician) and Advanced Practice Providers (APPs -  Physician Assistants and Nurse Practitioners) who all work together to provide you with the care you need, when you need it.  We recommend signing up for the patient portal called "MyChart".  Sign up information is provided on this After Visit Summary.  MyChart is used to connect with patients for Virtual Visits (Telemedicine).  Patients are able to view lab/test results, encounter notes, upcoming appointments, etc.  Non-urgent messages can be sent to your provider as well.   To learn more about what you can do with MyChart, go to [email or web address].    Your next appointment:   2 month(s)  Provider:   You may see Debbe Odea, MD or one of the following Advanced Practice Providers on your designated Care Team:   Nicolasa Ducking, NP Eula Listen, PA-C Cadence Fransico Michael, PA-C Charlsie Quest, NP

## 2023-03-29 ENCOUNTER — Other Ambulatory Visit: Payer: Self-pay

## 2023-03-29 MED ORDER — HYDROCHLOROTHIAZIDE 12.5 MG PO CAPS: ORAL_CAPSULE | ORAL | 0 refills | Status: DC

## 2023-05-16 ENCOUNTER — Ambulatory Visit
Admission: EM | Admit: 2023-05-16 | Discharge: 2023-05-16 | Disposition: A | Discharge status: Home / Self Care | Payer: BC Managed Care – PPO

## 2023-05-16 DIAGNOSIS — R051 Acute cough: Secondary | ICD-10-CM

## 2023-05-16 MED ORDER — BENZONATATE 100 MG PO CAPS: 100.0000 mg | ORAL_CAPSULE | Freq: Three times a day (TID) | ORAL | 0 refills | Status: DC | PRN

## 2023-05-16 NOTE — Discharge Instructions (Signed)
Suspect viral cause to your cough.  I have prescribed a cough medication to take as needed.  Ensure adequate fluid hydration and rest.  Follow-up if any symptoms persist or worsen.
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
## 2023-05-16 NOTE — ED Triage Notes (Signed)
Started with "Dry Cough" while at beach on "Thursday". Has "just continued". No respiratory distress. No Sob. No nasal congestion/discharge. No fever.

## 2023-05-28 ENCOUNTER — Ambulatory Visit: Payer: BC Managed Care – PPO | Admitting: Cardiology

## 2023-06-10 ENCOUNTER — Other Ambulatory Visit: Payer: Self-pay

## 2023-06-10 MED ORDER — METOPROLOL SUCCINATE ER 25 MG PO TB24: 25.0000 mg | ORAL_TABLET | Freq: Every day | ORAL | 0 refills | Status: DC

## 2023-06-29 ENCOUNTER — Other Ambulatory Visit: Payer: Self-pay

## 2023-06-29 MED ORDER — HYDROCHLOROTHIAZIDE 12.5 MG PO CAPS: ORAL_CAPSULE | ORAL | 0 refills | Status: DC

## 2023-08-01 ENCOUNTER — Other Ambulatory Visit: Payer: Self-pay | Admitting: Medical

## 2023-08-10 ENCOUNTER — Encounter: Payer: Self-pay | Admitting: Cardiology

## 2023-08-10 ENCOUNTER — Ambulatory Visit: Payer: BC Managed Care – PPO | Attending: Cardiology | Admitting: Cardiology

## 2023-08-10 VITALS — BP 130/76 | HR 81 | Ht 69.0 in | Wt 264.4 lb

## 2023-08-10 DIAGNOSIS — E782 Mixed hyperlipidemia: Secondary | ICD-10-CM | POA: Diagnosis not present

## 2023-08-10 DIAGNOSIS — I493 Ventricular premature depolarization: Secondary | ICD-10-CM

## 2023-08-10 DIAGNOSIS — I10 Essential (primary) hypertension: Secondary | ICD-10-CM | POA: Diagnosis not present

## 2023-08-10 NOTE — Patient Instructions (Signed)
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
## 2023-09-06 ENCOUNTER — Other Ambulatory Visit: Payer: Self-pay | Admitting: *Deleted

## 2023-09-06 MED ORDER — METOPROLOL SUCCINATE ER 25 MG PO TB24: 25.0000 mg | ORAL_TABLET | Freq: Every day | ORAL | 3 refills | Status: DC

## 2023-09-27 ENCOUNTER — Other Ambulatory Visit: Payer: Self-pay

## 2023-09-27 MED ORDER — HYDROCHLOROTHIAZIDE 12.5 MG PO CAPS: ORAL_CAPSULE | ORAL | 2 refills | Status: DC

## 2023-09-27 NOTE — Telephone Encounter (Signed)
Requested Prescriptions   Signed Prescriptions Disp Refills   hydrochlorothiazide (MICROZIDE) 12.5 MG capsule 90 capsule 2    Sig: TAKE 1 CAPSULE BY MOUTH EVERY DAY    Authorizing Provider: Debbe Odea    Ordering User: Guerry Minors

## 2023-12-02 DIAGNOSIS — N401 Enlarged prostate with lower urinary tract symptoms: Secondary | ICD-10-CM | POA: Diagnosis not present

## 2023-12-02 DIAGNOSIS — R3912 Poor urinary stream: Secondary | ICD-10-CM | POA: Diagnosis not present

## 2023-12-02 DIAGNOSIS — R972 Elevated prostate specific antigen [PSA]: Secondary | ICD-10-CM | POA: Diagnosis not present

## 2024-01-31 ENCOUNTER — Other Ambulatory Visit: Payer: Self-pay | Admitting: Medical

## 2024-01-31 ENCOUNTER — Other Ambulatory Visit: Payer: Self-pay

## 2024-01-31 MED ORDER — AMLODIPINE BESYLATE 5 MG PO TABS: 5.0000 mg | ORAL_TABLET | Freq: Every day | ORAL | 1 refills | Status: DC

## 2024-01-31 NOTE — Telephone Encounter (Signed)
 Last office visit:  08/10/23 with plan to f/u in 12 months Next office visit: none/does have active recall  Requested Prescriptions   Signed Prescriptions Disp Refills   amLODipine (NORVASC) 5 MG tablet 90 tablet 1    Sig: Take 1 tablet (5 mg total) by mouth daily.    Authorizing Provider: Debbe Odea    Ordering User: Guerry Minors

## 2024-04-25 ENCOUNTER — Ambulatory Visit: Payer: Self-pay | Admitting: *Deleted

## 2024-04-25 NOTE — Telephone Encounter (Signed)
 3rd attempt to contact patient/ wife on [place]  [phone] call back #. No answer, recording , call can not be completed as dialed  Chief Complaint: pain in umbilicus bending over since Saturday  Symptoms: see above Frequency: Saturday  Pertinent Negatives: Patient denies na  Disposition: [] ED /[] Urgent Care (no appt availability in office) / [] Appointment(In office/virtual)/ []  [facility]/ [] Home Care/ [] Refused Recommended Disposition /[]  Mobile Bus/ [x]  Follow-up with PCP Additional Notes:   3 attempts to contact patient . No answer, please advise  regarding sx of abdominal pain.    Reason for Disposition  Third attempt to contact caller AND no contact made. Phone number verified.  Answer Assessment - Initial Assessment Questions N/A C/o pain bending over in "belly button" per patient wife .  Protocols used: No Contact or Duplicate Contact Call-A-AH

## 2024-04-25 NOTE — Telephone Encounter (Signed)
 2nd attempt to contact patient or wife on DPR at [phone] and no answer, recording call can not be completed at this time, call again later or call operator for assistance. Unable to leave message.   Summary: pain by belly button when bending over   Copied From CRM #[id number]. Reason for Triage: Patient wife Edgar Saunders calling patient has pain by belly button when bending over, Edgar Saunders said it started on Saturday. asking to schedule appt  Call wife phone# Edgar Saunders  [phone]

## 2024-04-25 NOTE — Telephone Encounter (Signed)
 [name] has been scheduled an appointment to see Dr. Geralyn Knee on 04/27/24 at 3:20 pm.

## 2024-04-25 NOTE — Telephone Encounter (Signed)
 Called patient [phone] to review sx of pain in umbilicus. No answer , recording call can not be completed at this time, try call again later

## 2024-04-26 NOTE — Progress Notes (Unsigned)
     Edgar Saunders T. Edgar Bernardi, MD, CAQ Sports Medicine [facility] [address] [place], [address]  Phone: [phone]  FAX: [phone]  PERLA Edgar Saunders - 58 y.o. male  MRN [id number]  Date of Birth: May 05, 1966  Date: 04/27/2024  PCP: Edgar Curt, MD  Referral: Edgar Curt, MD  No chief complaint on file.  Subjective:   Edgar Saunders is a 58 y.o. very pleasant male patient with There is no height or weight on file to calculate BMI. who presents with the following:  He is a very nice patient, he presents today with question possible hernia.    Review of Systems is noted in the HPI, as appropriate  Objective:   There were no vitals taken for this visit.  GEN: No acute distress; alert,appropriate. PULM: Breathing comfortably in no respiratory distress PSYCH: Normally interactive.   Laboratory and Imaging Data:  Assessment and Plan:   ***

## 2024-04-27 ENCOUNTER — Ambulatory Visit: Admitting: Family Medicine

## 2024-04-27 ENCOUNTER — Encounter: Payer: Self-pay | Admitting: Family Medicine

## 2024-04-27 VITALS — BP 140/72 | HR 99 | Temp 98.3°F | Ht 67.0 in | Wt 261.0 lb

## 2024-04-27 DIAGNOSIS — R1084 Generalized abdominal pain: Secondary | ICD-10-CM | POA: Diagnosis not present

## 2024-05-03 ENCOUNTER — Other Ambulatory Visit: Payer: Self-pay

## 2024-05-03 MED ORDER — HYDROCHLOROTHIAZIDE 12.5 MG PO CAPS: ORAL_CAPSULE | ORAL | 3 refills | Status: AC

## 2024-05-03 MED ORDER — METOPROLOL SUCCINATE ER 25 MG PO TB24: 25.0000 mg | ORAL_TABLET | Freq: Every day | ORAL | 3 refills | Status: AC

## 2024-07-28 ENCOUNTER — Other Ambulatory Visit: Payer: Self-pay | Admitting: Cardiology

## 2024-08-10 ENCOUNTER — Other Ambulatory Visit: Payer: Self-pay | Admitting: Medical

## 2024-09-02 ENCOUNTER — Other Ambulatory Visit: Payer: Self-pay | Admitting: Cardiology

## 2024-09-04 NOTE — Telephone Encounter (Signed)
 Pt is scheduled on 09/18/24

## 2024-09-04 NOTE — Telephone Encounter (Signed)
Please contact pt for future appointment. Pt overdue for follow up.

## 2024-09-18 ENCOUNTER — Ambulatory Visit: Admitting: Medical

## 2024-09-18 NOTE — Progress Notes (Deleted)
  Cardiology Office Note   Date:  09/18/2024  ID:  Edgar Saunders, DOB [birth year], MRN [id number] PCP: Watt Mirza, MD  [facility] Providers Cardiologist:  Redell Cave, MD { Click to update primary MD,subspecialty MD or APP then REFRESH:1}    History of Present Illness Edgar Saunders is a 58 y.o. male with a h/o HTN, HLD who presents for follow-up.   Cardiac monitor 04/2021 showed PVC burden 4%, 4 episodes of pSVT.   The patient was last seen 07/2023 and was stable from a cardiac perspective.   Today,   ROS: ***  Studies Reviewed      *** Risk Assessment/Calculations {Does this patient have ATRIAL FIBRILLATION?[phone]} No BP recorded.  {Refresh Note OR Click here to enter BP  :1}***       Physical Exam VS:  There were no vitals taken for this visit.       Wt Readings from Last 3 Encounters:  04/27/24 261 lb (118.4 kg)  08/10/23 264 lb 6.4 oz (119.9 kg)  05/16/23 240 lb (108.9 kg)    GEN: Well nourished, well developed in no acute distress NECK: No JVD; No carotid bruits CARDIAC: ***RRR, no murmurs, rubs, gallops RESPIRATORY:  Clear to auscultation without rales, wheezing or rhonchi  ABDOMEN: Soft, non-tender, non-distended EXTREMITIES:  No edema; No deformity   ASSESSMENT AND PLAN ***    {Are you ordering a CV Procedure (e.g. stress test, cath, DCCV, TEE, etc)?   Press F2        :[id number]}  Dispo: ***  Signed, Sheyenne Konz VEAR Fishman, PA-C

## 2024-09-28 ENCOUNTER — Ambulatory Visit: Admitting: Medical

## 2024-10-03 ENCOUNTER — Ambulatory Visit: Attending: Medical | Admitting: Medical

## 2024-10-03 ENCOUNTER — Encounter: Payer: Self-pay | Admitting: Medical

## 2024-10-03 VITALS — BP 140/70 | HR 74 | Ht 69.0 in | Wt 272.4 lb

## 2024-10-03 DIAGNOSIS — I493 Ventricular premature depolarization: Secondary | ICD-10-CM

## 2024-10-03 DIAGNOSIS — E782 Mixed hyperlipidemia: Secondary | ICD-10-CM | POA: Diagnosis not present

## 2024-10-03 DIAGNOSIS — I10 Essential (primary) hypertension: Secondary | ICD-10-CM | POA: Diagnosis not present

## 2024-10-03 NOTE — Progress Notes (Signed)
  Cardiology Office Note   Date:  10/03/2024  ID:  Edgar Saunders, DOB 1966-05-17, MRN [id number] PCP: Watt Mirza, MD  [facility] Providers Cardiologist:  Redell Cave, MD   History of Present Illness Edgar Saunders is a 58 y.o. male with a history of hypertension, hyperlipidemia, and PVCs who presents for 1 year follow-up.  Cardiac monitor in May 2022 showed occasional PVCs of 4% burden and 4 episodes of PSVT.  Patient was last seen 08/10/2023 and stable from a cardiac perspective.  Today, the patient is overall doing well. He denies chest pain, SOB, lower leg edema, lightheadedness, dizziness, palpitations, orthopnea, pnd. BP is normally 130/80. Diet could be better. He walks at work all day.   Studies Reviewed EKG Interpretation Date/Time:  Tuesday October 03 2024 15:34:31 EDT Ventricular Rate:  74 PR Interval:  200 QRS Duration:  98 QT Interval:  410 QTC Calculation: 455 R Axis:   29  Text Interpretation: Sinus rhythm with occasional Premature ventricular complexes When compared with ECG of 02-Jul-2020 05:44, Premature ventricular complexes are now Present Confirmed by Franchester, Daylee Delahoz ([id number]) on 10/03/2024 3:41:08 PM    Heart monitor 2022  Patient had a min HR of 43 bpm, max HR of 174 bpm, and avg HR of 68 bpm. Predominant underlying rhythm was Sinus Rhythm. 4 Supraventricular Tachycardia runs occurred, the run with the fastest interval lasting 4 beats with a max rate of 174 bpm. SVE Couplets were rare (<1.0%), and SVE Triplets were rare  (<1.0%).  Isolated VEs were occasional (4.0%, 25729), and no VE Couplets or VE Triplets were present. Ventricular Bigeminy and Trigeminy were present. Occasional PVCs, 4% burden.      Physical Exam VS:  BP (!) 140/70 (BP Location: Left Arm, Patient Position: Sitting, Cuff Size: Large)   Pulse 74 Comment: 88 oximeter  Ht 5' 9 (1.753 m)   Wt 272 lb 6.4 oz (123.6 kg)   SpO2 94%   BMI 40.23 kg/m        Wt Readings  from Last 3 Encounters:  10/03/24 272 lb 6.4 oz (123.6 kg)  04/27/24 261 lb (118.4 kg)  08/10/23 264 lb 6.4 oz (119.9 kg)    GEN: Well nourished, well developed in no acute distress NECK: No JVD; No carotid bruits CARDIAC: RRR, no murmurs, rubs, gallops RESPIRATORY:  Clear to auscultation without rales, wheezing or rhonchi  ABDOMEN: Soft, non-tender, non-distended EXTREMITIES:  No edema; No deformity   ASSESSMENT AND PLAN  PVCs Prior heart monitor showed 4% PVC burden. He denies palpitations. Continue Toprol  25mg  daily.   HTN BP is mildly elevated. He says blood pressure is normally 130/80s. Discussed lifestyle. Continue amlodipine  5mg  daily, hydrochlorothiazide  12.5mg  daily, Losartan  100mg  daily, Toprol  25mg  daily.   HLD Previously declined statin. Discussed diet and exercise at visit.     Dispo: follow-up in 1 year  Signed, Maricarmen Braziel VEAR Franchester, PA-C

## 2024-10-03 NOTE — Patient Instructions (Signed)
 Medication Instructions:  Your physician recommends that you continue on your current medications as directed. Please refer to the Current Medication list given to you today.    *If you need a refill on your cardiac medications before your next appointment, please call your pharmacy*  Lab Work: No labs ordered today    Testing/Procedures: No test ordered today   Follow-Up: At [facility], you and your health needs are our priority.  As part of our continuing mission to provide you with exceptional heart care, our providers are all part of one team.  This team includes your primary Cardiologist (physician) and Advanced Practice Providers or APPs (Physician Assistants and Nurse Practitioners) who all work together to provide you with the care you need, when you need it.  Your next appointment:   1 year(s)  Provider:   Redell Cave, MD or Cadence Franchester, PA-C

## 2024-10-06 ENCOUNTER — Other Ambulatory Visit: Payer: Self-pay | Admitting: Cardiology

## 2024-10-21 ENCOUNTER — Other Ambulatory Visit: Payer: Self-pay | Admitting: Cardiology
